# Patient Record
Sex: Female | Born: 1945 | Marital: Married
Health system: Southern US, Community
[De-identification: ages and names within clinical notes are randomized; demographics above are authoritative.]

---

## 2012-09-21 ENCOUNTER — Ambulatory Visit: Payer: Self-pay | Admitting: Cardiology

## 2012-09-29 ENCOUNTER — Ambulatory Visit: Payer: Self-pay | Admitting: Oncology

## 2012-09-30 ENCOUNTER — Ambulatory Visit: Payer: Self-pay | Admitting: Oncology

## 2012-09-30 LAB — COMPREHENSIVE METABOLIC PANEL
Albumin: 3.3 g/dL — ABNORMAL LOW (ref 3.4–5.0)
Alkaline Phosphatase: 101 U/L (ref 50–136)
Anion Gap: 12 (ref 7–16)
BUN: 11 mg/dL (ref 7–18)
Bilirubin,Total: 0.6 mg/dL (ref 0.2–1.0)
Calcium, Total: 9.8 mg/dL (ref 8.5–10.1)
Chloride: 95 mmol/L — ABNORMAL LOW (ref 98–107)
Creatinine: 0.93 mg/dL (ref 0.60–1.30)
Glucose: 136 mg/dL — ABNORMAL HIGH (ref 65–99)
Osmolality: 270 (ref 275–301)
Potassium: 4 mmol/L (ref 3.5–5.1)
SGOT(AST): 17 U/L (ref 15–37)
SGPT (ALT): 16 U/L (ref 12–78)
Sodium: 134 mmol/L — ABNORMAL LOW (ref 136–145)
Total Protein: 8.5 g/dL — ABNORMAL HIGH (ref 6.4–8.2)

## 2012-09-30 LAB — CBC CANCER CENTER
Basophil %: 0.5 %
Eosinophil %: 1.2 %
HGB: 10.8 g/dL — ABNORMAL LOW (ref 12.0–16.0)
Lymphocyte #: 1.6 x10 3/mm (ref 1.0–3.6)
Lymphocyte %: 11.6 %
MCH: 27 pg (ref 26.0–34.0)
MCHC: 33 g/dL (ref 32.0–36.0)
MCV: 82 fL (ref 80–100)
Monocyte %: 4.3 %
Neutrophil #: 11.7 x10 3/mm — ABNORMAL HIGH (ref 1.4–6.5)
Neutrophil %: 82.4 %
RBC: 3.99 10*6/uL (ref 3.80–5.20)

## 2012-09-30 LAB — APTT: Activated PTT: 40.5 secs — ABNORMAL HIGH (ref 23.6–35.9)

## 2012-10-03 ENCOUNTER — Ambulatory Visit: Payer: Self-pay | Admitting: Oncology

## 2012-10-06 ENCOUNTER — Ambulatory Visit: Payer: Self-pay | Admitting: Internal Medicine

## 2012-10-10 ENCOUNTER — Ambulatory Visit: Payer: Self-pay

## 2012-10-11 LAB — CBC CANCER CENTER
Basophil %: 0.1 %
Eosinophil #: 0.1 x10 3/mm (ref 0.0–0.7)
HGB: 10.4 g/dL — ABNORMAL LOW (ref 12.0–16.0)
Lymphocyte #: 1.5 x10 3/mm (ref 1.0–3.6)
MCHC: 32.5 g/dL (ref 32.0–36.0)
MCV: 81 fL (ref 80–100)
Monocyte #: 0.8 x10 3/mm (ref 0.2–0.9)
Neutrophil %: 86.5 %
Platelet: 377 x10 3/mm (ref 150–440)
RDW: 15.4 % — ABNORMAL HIGH (ref 11.5–14.5)
WBC: 17.7 x10 3/mm — ABNORMAL HIGH (ref 3.6–11.0)

## 2012-10-11 LAB — COMPREHENSIVE METABOLIC PANEL
Anion Gap: 6 — ABNORMAL LOW (ref 7–16)
Bilirubin,Total: 0.4 mg/dL (ref 0.2–1.0)
Calcium, Total: 8.9 mg/dL (ref 8.5–10.1)
Chloride: 97 mmol/L — ABNORMAL LOW (ref 98–107)
Co2: 29 mmol/L (ref 21–32)
Osmolality: 269 (ref 275–301)
Potassium: 4.4 mmol/L (ref 3.5–5.1)
SGPT (ALT): 21 U/L (ref 12–78)
Sodium: 132 mmol/L — ABNORMAL LOW (ref 136–145)

## 2012-10-12 LAB — PATHOLOGY REPORT

## 2012-10-17 LAB — CBC CANCER CENTER
Basophil #: 0.1 x10 3/mm (ref 0.0–0.1)
Eosinophil #: 0.3 x10 3/mm (ref 0.0–0.7)
Eosinophil %: 2.2 %
HCT: 28.3 % — ABNORMAL LOW (ref 35.0–47.0)
HGB: 9.4 g/dL — ABNORMAL LOW (ref 12.0–16.0)
Lymphocyte #: 1 x10 3/mm (ref 1.0–3.6)
Lymphocyte %: 6.9 %
MCH: 26.4 pg (ref 26.0–34.0)
MCV: 80 fL (ref 80–100)
Neutrophil %: 86.3 %
Platelet: 222 x10 3/mm (ref 150–440)
RBC: 3.55 10*6/uL — ABNORMAL LOW (ref 3.80–5.20)
WBC: 14.8 x10 3/mm — ABNORMAL HIGH (ref 3.6–11.0)

## 2012-10-17 LAB — COMPREHENSIVE METABOLIC PANEL
Albumin: 2.5 g/dL — ABNORMAL LOW (ref 3.4–5.0)
Alkaline Phosphatase: 71 U/L (ref 50–136)
Anion Gap: 6 — ABNORMAL LOW (ref 7–16)
BUN: 12 mg/dL (ref 7–18)
Bilirubin,Total: 0.5 mg/dL (ref 0.2–1.0)
Co2: 28 mmol/L (ref 21–32)
Creatinine: 0.77 mg/dL (ref 0.60–1.30)
EGFR (African American): >60
EGFR (Non-African Amer.): >60
Glucose: 131 mg/dL — ABNORMAL HIGH (ref 65–99)
Osmolality: 262 (ref 275–301)
Potassium: 4.1 mmol/L (ref 3.5–5.1)
SGPT (ALT): 19 U/L (ref 12–78)
Total Protein: 6.8 g/dL (ref 6.4–8.2)

## 2012-10-24 ENCOUNTER — Ambulatory Visit: Payer: Self-pay | Admitting: Oncology

## 2012-10-25 LAB — CBC CANCER CENTER
Basophil #: 0.1 x10 3/mm (ref 0.0–0.1)
Basophil %: 0.9 %
Eosinophil #: 0.1 x10 3/mm (ref 0.0–0.7)
Eosinophil %: 1.6 %
HCT: 30.8 % — ABNORMAL LOW (ref 35.0–47.0)
Lymphocyte #: 0.9 x10 3/mm — ABNORMAL LOW (ref 1.0–3.6)
Lymphocyte %: 15.6 %
MCH: 25.5 pg — ABNORMAL LOW (ref 26.0–34.0)
MCHC: 31.3 g/dL — ABNORMAL LOW (ref 32.0–36.0)
MCV: 81 fL (ref 80–100)
Monocyte %: 8.6 %
Neutrophil #: 4.4 x10 3/mm (ref 1.4–6.5)
RDW: 16.3 % — ABNORMAL HIGH (ref 11.5–14.5)
WBC: 6 x10 3/mm (ref 3.6–11.0)

## 2012-10-25 LAB — COMPREHENSIVE METABOLIC PANEL
Alkaline Phosphatase: 101 U/L (ref 50–136)
Anion Gap: 9 (ref 7–16)
BUN: 6 mg/dL — ABNORMAL LOW (ref 7–18)
Bilirubin,Total: 0.2 mg/dL (ref 0.2–1.0)
Chloride: 96 mmol/L — ABNORMAL LOW (ref 98–107)
Creatinine: 0.85 mg/dL (ref 0.60–1.30)
EGFR (African American): >60
EGFR (Non-African Amer.): >60
Glucose: 210 mg/dL — ABNORMAL HIGH (ref 65–99)
Osmolality: 270 (ref 275–301)
SGOT(AST): 18 U/L (ref 15–37)
SGPT (ALT): 27 U/L (ref 12–78)
Sodium: 133 mmol/L — ABNORMAL LOW (ref 136–145)
Total Protein: 6.9 g/dL (ref 6.4–8.2)

## 2012-10-25 LAB — URINALYSIS, COMPLETE
Bilirubin,UR: NEGATIVE
Hyaline Cast: 42
Nitrite: NEGATIVE
Ph: 5 (ref 4.5–8.0)
RBC,UR: 1 /HPF (ref 0–5)
Specific Gravity: 1.013 (ref 1.003–1.030)
Squamous Epithelial: 2
WBC UR: 57 /HPF (ref 0–5)

## 2012-10-27 LAB — URINE CULTURE

## 2012-11-04 LAB — COMPREHENSIVE METABOLIC PANEL
Bilirubin,Total: 0.2 mg/dL (ref 0.2–1.0)
Chloride: 99 mmol/L (ref 98–107)
Co2: 24 mmol/L (ref 21–32)
EGFR (Non-African Amer.): >60
Glucose: 153 mg/dL — ABNORMAL HIGH (ref 65–99)
Osmolality: 274 (ref 275–301)
SGPT (ALT): 25 U/L (ref 12–78)

## 2012-11-04 LAB — CBC CANCER CENTER
Eosinophil #: 0.1 x10 3/mm (ref 0.0–0.7)
HCT: 30.1 % — ABNORMAL LOW (ref 35.0–47.0)
HGB: 9.6 g/dL — ABNORMAL LOW (ref 12.0–16.0)
Lymphocyte #: 0.8 x10 3/mm — ABNORMAL LOW (ref 1.0–3.6)
MCH: 25.9 pg — ABNORMAL LOW (ref 26.0–34.0)
MCHC: 32.1 g/dL (ref 32.0–36.0)
Monocyte #: 0.5 x10 3/mm (ref 0.2–0.9)
Neutrophil #: 3.6 x10 3/mm (ref 1.4–6.5)
Neutrophil %: 73 %
Platelet: 221 x10 3/mm (ref 150–440)
RBC: 3.72 10*6/uL — ABNORMAL LOW (ref 3.80–5.20)
WBC: 5 x10 3/mm (ref 3.6–11.0)

## 2012-11-10 LAB — CBC CANCER CENTER
Basophil %: 1.3 %
HGB: 9.8 g/dL — ABNORMAL LOW (ref 12.0–16.0)
Lymphocyte %: 17.9 %
MCH: 26 pg (ref 26.0–34.0)
Monocyte #: 0.3 x10 3/mm (ref 0.2–0.9)
Neutrophil %: 70.6 %
Platelet: 189 x10 3/mm (ref 150–440)
RDW: 17.6 % — ABNORMAL HIGH (ref 11.5–14.5)
WBC: 4 x10 3/mm (ref 3.6–11.0)

## 2012-11-11 LAB — COMPREHENSIVE METABOLIC PANEL
Albumin: 3 g/dL — ABNORMAL LOW (ref 3.4–5.0)
Anion Gap: 8 (ref 7–16)
Bilirubin,Total: 0.2 mg/dL (ref 0.2–1.0)
Creatinine: 0.73 mg/dL (ref 0.60–1.30)
EGFR (African American): >60
EGFR (Non-African Amer.): >60
Glucose: 132 mg/dL — ABNORMAL HIGH (ref 65–99)
Potassium: 4 mmol/L (ref 3.5–5.1)
SGOT(AST): 16 U/L (ref 15–37)
SGPT (ALT): 19 U/L (ref 12–78)
Sodium: 134 mmol/L — ABNORMAL LOW (ref 136–145)
Total Protein: 6.6 g/dL (ref 6.4–8.2)

## 2012-11-14 ENCOUNTER — Inpatient Hospital Stay: Payer: Self-pay | Admitting: Oncology

## 2012-11-15 LAB — CBC WITH DIFFERENTIAL/PLATELET
Basophil #: 0 10*3/uL (ref 0.0–0.1)
Basophil %: 0.2 %
HGB: 7.9 g/dL — ABNORMAL LOW (ref 12.0–16.0)
Lymphocyte #: 0.4 10*3/uL — ABNORMAL LOW (ref 1.0–3.6)
MCH: 26.8 pg (ref 26.0–34.0)
MCHC: 33.4 g/dL (ref 32.0–36.0)
MCV: 80 fL (ref 80–100)
Monocyte #: 0.1 x10 3/mm — ABNORMAL LOW (ref 0.2–0.9)
Neutrophil %: 91.1 %
Platelet: 106 10*3/uL — ABNORMAL LOW (ref 150–440)
RBC: 2.95 10*6/uL — ABNORMAL LOW (ref 3.80–5.20)
RDW: 18.1 % — ABNORMAL HIGH (ref 11.5–14.5)
WBC: 7.1 10*3/uL (ref 3.6–11.0)

## 2012-11-15 LAB — BASIC METABOLIC PANEL
Anion Gap: 6 — ABNORMAL LOW (ref 7–16)
Co2: 28 mmol/L (ref 21–32)
EGFR (African American): >60
Osmolality: 260 (ref 275–301)
Sodium: 130 mmol/L — ABNORMAL LOW (ref 136–145)

## 2012-11-18 LAB — CBC CANCER CENTER
Basophil #: 0 x10 3/mm (ref 0.0–0.1)
Eosinophil %: 1.6 %
HCT: 25.2 % — ABNORMAL LOW (ref 35.0–47.0)
Lymphocyte #: 0.3 x10 3/mm — ABNORMAL LOW (ref 1.0–3.6)
MCV: 82 fL (ref 80–100)
Monocyte #: 0.3 x10 3/mm (ref 0.2–0.9)
Monocyte %: 7.9 %
Neutrophil #: 3.2 x10 3/mm (ref 1.4–6.5)
Neutrophil %: 82.5 %
RBC: 3.06 10*6/uL — ABNORMAL LOW (ref 3.80–5.20)
WBC: 3.8 x10 3/mm (ref 3.6–11.0)

## 2012-11-18 LAB — COMPREHENSIVE METABOLIC PANEL
Albumin: 2.5 g/dL — ABNORMAL LOW (ref 3.4–5.0)
Alkaline Phosphatase: 116 U/L (ref 50–136)
Anion Gap: 9 (ref 7–16)
BUN: 4 mg/dL — ABNORMAL LOW (ref 7–18)
Bilirubin,Total: 0.3 mg/dL (ref 0.2–1.0)
Calcium, Total: 8.2 mg/dL — ABNORMAL LOW (ref 8.5–10.1)
Chloride: 94 mmol/L — ABNORMAL LOW (ref 98–107)
Co2: 31 mmol/L (ref 21–32)
Creatinine: 0.8 mg/dL (ref 0.60–1.30)
Glucose: 146 mg/dL — ABNORMAL HIGH (ref 65–99)
Osmolality: 268 (ref 275–301)
Potassium: 3.6 mmol/L (ref 3.5–5.1)
SGOT(AST): 21 U/L (ref 15–37)
Sodium: 134 mmol/L — ABNORMAL LOW (ref 136–145)
Total Protein: 6.9 g/dL (ref 6.4–8.2)

## 2012-11-19 LAB — CULTURE, BLOOD (SINGLE)

## 2012-11-23 ENCOUNTER — Ambulatory Visit: Payer: Self-pay | Admitting: Oncology

## 2012-11-23 LAB — CBC CANCER CENTER
Eosinophil %: 0.2 %
HCT: 26.9 % — ABNORMAL LOW (ref 35.0–47.0)
Lymphocyte %: 8.5 %
Monocyte #: 0.3 x10 3/mm (ref 0.2–0.9)
Monocyte %: 5.6 %
Neutrophil %: 85.6 %
Platelet: 116 x10 3/mm — ABNORMAL LOW (ref 150–440)
RBC: 3.28 10*6/uL — ABNORMAL LOW (ref 3.80–5.20)

## 2012-11-25 LAB — CBC CANCER CENTER
Eosinophil #: 0 x10 3/mm (ref 0.0–0.7)
Eosinophil %: 0.5 %
HGB: 8.3 g/dL — ABNORMAL LOW (ref 12.0–16.0)
MCV: 81 fL (ref 80–100)
Monocyte %: 8.6 %
Neutrophil #: 4 x10 3/mm (ref 1.4–6.5)
RDW: 22 % — ABNORMAL HIGH (ref 11.5–14.5)
WBC: 5.1 x10 3/mm (ref 3.6–11.0)

## 2012-11-25 LAB — COMPREHENSIVE METABOLIC PANEL
Albumin: 2.5 g/dL — ABNORMAL LOW (ref 3.4–5.0)
Alkaline Phosphatase: 104 U/L (ref 50–136)
Anion Gap: 12 (ref 7–16)
Bilirubin,Total: 0.4 mg/dL (ref 0.2–1.0)
Calcium, Total: 6.5 mg/dL — CL (ref 8.5–10.1)
EGFR (Non-African Amer.): >60
Glucose: 120 mg/dL — ABNORMAL HIGH (ref 65–99)
Potassium: 3.2 mmol/L — ABNORMAL LOW (ref 3.5–5.1)
SGOT(AST): 33 U/L (ref 15–37)
Sodium: 132 mmol/L — ABNORMAL LOW (ref 136–145)

## 2012-12-02 LAB — CBC CANCER CENTER
Basophil #: 0 x10 3/mm (ref 0.0–0.1)
Eosinophil #: 0.1 x10 3/mm (ref 0.0–0.7)
Eosinophil %: 4 %
Neutrophil %: 65.9 %
Platelet: 361 x10 3/mm (ref 150–440)
RBC: 3.19 10*6/uL — ABNORMAL LOW (ref 3.80–5.20)
RDW: 21.8 % — ABNORMAL HIGH (ref 11.5–14.5)

## 2012-12-02 LAB — COMPREHENSIVE METABOLIC PANEL
Anion Gap: 12 (ref 7–16)
BUN: 6 mg/dL — ABNORMAL LOW (ref 7–18)
Chloride: 96 mmol/L — ABNORMAL LOW (ref 98–107)
Co2: 29 mmol/L (ref 21–32)
Creatinine: 0.73 mg/dL (ref 0.60–1.30)
EGFR (African American): >60
EGFR (Non-African Amer.): >60
Glucose: 98 mg/dL (ref 65–99)
Potassium: 3.6 mmol/L (ref 3.5–5.1)
Sodium: 137 mmol/L (ref 136–145)

## 2012-12-08 LAB — CBC CANCER CENTER
Basophil #: 0 x10 3/mm (ref 0.0–0.1)
Eosinophil %: 2.1 %
HCT: 26.5 % — ABNORMAL LOW (ref 35.0–47.0)
HGB: 8.7 g/dL — ABNORMAL LOW (ref 12.0–16.0)
Lymphocyte %: 16.2 %
MCH: 27.9 pg (ref 26.0–34.0)
Monocyte #: 0.2 x10 3/mm (ref 0.2–0.9)
Monocyte %: 8.9 %
Neutrophil #: 2 x10 3/mm (ref 1.4–6.5)
Neutrophil %: 71.6 %
Platelet: 329 x10 3/mm (ref 150–440)
RDW: 22.4 % — ABNORMAL HIGH (ref 11.5–14.5)

## 2012-12-24 ENCOUNTER — Ambulatory Visit: Payer: Self-pay | Admitting: Oncology

## 2013-01-06 LAB — COMPREHENSIVE METABOLIC PANEL
Albumin: 2.9 g/dL — ABNORMAL LOW (ref 3.4–5.0)
Alkaline Phosphatase: 84 U/L (ref 50–136)
Anion Gap: 7 (ref 7–16)
BUN: 8 mg/dL (ref 7–18)
Chloride: 105 mmol/L (ref 98–107)
Co2: 31 mmol/L (ref 21–32)
Creatinine: 0.81 mg/dL (ref 0.60–1.30)
EGFR (African American): >60
EGFR (Non-African Amer.): >60
Glucose: 115 mg/dL — ABNORMAL HIGH (ref 65–99)
SGPT (ALT): 13 U/L (ref 12–78)
Sodium: 143 mmol/L (ref 136–145)

## 2013-01-06 LAB — CBC CANCER CENTER
Basophil %: 0.5 %
HCT: 28.3 % — ABNORMAL LOW (ref 35.0–47.0)
HGB: 9.2 g/dL — ABNORMAL LOW (ref 12.0–16.0)
Lymphocyte %: 14 %
MCH: 30.3 pg (ref 26.0–34.0)
Monocyte #: 0.4 x10 3/mm (ref 0.2–0.9)
Platelet: 299 x10 3/mm (ref 150–440)
RBC: 3.05 10*6/uL — ABNORMAL LOW (ref 3.80–5.20)
WBC: 7.8 x10 3/mm (ref 3.6–11.0)

## 2013-01-16 LAB — CBC CANCER CENTER
Basophil %: 0.5 %
HGB: 10.1 g/dL — ABNORMAL LOW (ref 12.0–16.0)
Lymphocyte #: 0.8 x10 3/mm — ABNORMAL LOW (ref 1.0–3.6)
Monocyte #: 1.3 x10 3/mm — ABNORMAL HIGH (ref 0.2–0.9)
Monocyte %: 10.7 %
Neutrophil #: 9.5 x10 3/mm — ABNORMAL HIGH (ref 1.4–6.5)
Neutrophil %: 79.2 %
RDW: 21.5 % — ABNORMAL HIGH (ref 11.5–14.5)
WBC: 11.9 x10 3/mm — ABNORMAL HIGH (ref 3.6–11.0)

## 2013-01-16 LAB — COMPREHENSIVE METABOLIC PANEL
Albumin: 3.6 g/dL (ref 3.4–5.0)
BUN: 7 mg/dL (ref 7–18)
Co2: 25 mmol/L (ref 21–32)
Creatinine: 0.81 mg/dL (ref 0.60–1.30)
EGFR (African American): >60
EGFR (Non-African Amer.): >60
Potassium: 2.9 mmol/L — ABNORMAL LOW (ref 3.5–5.1)
Total Protein: 7.3 g/dL (ref 6.4–8.2)

## 2013-01-23 ENCOUNTER — Ambulatory Visit: Payer: Self-pay | Admitting: Oncology

## 2013-01-30 LAB — CBC CANCER CENTER
Basophil #: 0.1 x10 3/mm (ref 0.0–0.1)
Basophil %: 0.6 %
Eosinophil #: 0.1 x10 3/mm (ref 0.0–0.7)
HCT: 24.9 % — ABNORMAL LOW (ref 35.0–47.0)
HGB: 8.1 g/dL — ABNORMAL LOW (ref 12.0–16.0)
Lymphocyte %: 6.1 %
MCHC: 32.6 g/dL (ref 32.0–36.0)
MCV: 97 fL (ref 80–100)
Monocyte %: 6.8 %
Neutrophil %: 86 %
RBC: 2.56 10*6/uL — ABNORMAL LOW (ref 3.80–5.20)
RDW: 20 % — ABNORMAL HIGH (ref 11.5–14.5)
WBC: 15 x10 3/mm — ABNORMAL HIGH (ref 3.6–11.0)

## 2013-01-30 LAB — COMPREHENSIVE METABOLIC PANEL
Albumin: 2.9 g/dL — ABNORMAL LOW (ref 3.4–5.0)
Alkaline Phosphatase: 66 U/L
Bilirubin,Total: 0.4 mg/dL (ref 0.2–1.0)
Calcium, Total: 6 mg/dL — CL (ref 8.5–10.1)
Chloride: 97 mmol/L — ABNORMAL LOW (ref 98–107)
Co2: 27 mmol/L (ref 21–32)
EGFR (African American): 59 — ABNORMAL LOW
EGFR (Non-African Amer.): 51 — ABNORMAL LOW
Glucose: 197 mg/dL — ABNORMAL HIGH (ref 65–99)
SGOT(AST): 13 U/L — ABNORMAL LOW (ref 15–37)
SGPT (ALT): 12 U/L (ref 12–78)
Total Protein: 7 g/dL (ref 6.4–8.2)

## 2013-02-03 ENCOUNTER — Inpatient Hospital Stay: Payer: Self-pay | Admitting: Internal Medicine

## 2013-02-03 LAB — URINALYSIS, COMPLETE
Glucose,UR: NEGATIVE mg/dL (ref 0–75)
Ketone: NEGATIVE
Nitrite: NEGATIVE
Ph: 5 (ref 4.5–8.0)
RBC,UR: 4 /HPF (ref 0–5)
Squamous Epithelial: 1
WBC UR: 9 /HPF (ref 0–5)

## 2013-02-03 LAB — COMPREHENSIVE METABOLIC PANEL
Albumin: 2.3 g/dL — ABNORMAL LOW (ref 3.4–5.0)
Alkaline Phosphatase: 67 U/L
Anion Gap: 6 — ABNORMAL LOW (ref 7–16)
BUN: 21 mg/dL — ABNORMAL HIGH (ref 7–18)
BUN: 23 mg/dL — ABNORMAL HIGH (ref 7–18)
Bilirubin,Total: 0.5 mg/dL (ref 0.2–1.0)
Bilirubin,Total: 0.5 mg/dL (ref 0.2–1.0)
Calcium, Total: 7.4 mg/dL — ABNORMAL LOW (ref 8.5–10.1)
Calcium, Total: 6.6 mg/dL — CL (ref 8.5–10.1)
Chloride: 98 mmol/L (ref 98–107)
Chloride: 101 mmol/L (ref 98–107)
Co2: 24 mmol/L (ref 21–32)
Co2: 25 mmol/L (ref 21–32)
EGFR (African American): 54 — ABNORMAL LOW
EGFR (Non-African Amer.): 39 — ABNORMAL LOW
Glucose: 122 mg/dL — ABNORMAL HIGH (ref 65–99)
Glucose: 150 mg/dL — ABNORMAL HIGH (ref 65–99)
Osmolality: 271 (ref 275–301)
Potassium: 5.4 mmol/L — ABNORMAL HIGH (ref 3.5–5.1)
SGOT(AST): 28 U/L (ref 15–37)
SGPT (ALT): 10 U/L — ABNORMAL LOW (ref 12–78)
SGPT (ALT): 13 U/L (ref 12–78)
Sodium: 131 mmol/L — ABNORMAL LOW (ref 136–145)
Sodium: 132 mmol/L — ABNORMAL LOW (ref 136–145)
Total Protein: 6.7 g/dL (ref 6.4–8.2)
Total Protein: 6.1 g/dL — ABNORMAL LOW (ref 6.4–8.2)

## 2013-02-03 LAB — CBC WITH DIFFERENTIAL/PLATELET
Eosinophil #: 0 10*3/uL (ref 0.0–0.7)
Eosinophil %: 0.2 %
HCT: 22.9 % — ABNORMAL LOW (ref 35.0–47.0)
Lymphocyte %: 6.1 %
MCV: 99 fL (ref 80–100)
Monocyte #: 0.2 x10 3/mm (ref 0.2–0.9)
Neutrophil #: 11 10*3/uL — ABNORMAL HIGH (ref 1.4–6.5)
Platelet: 96 10*3/uL — ABNORMAL LOW (ref 150–440)
RDW: 17.6 % — ABNORMAL HIGH (ref 11.5–14.5)

## 2013-02-03 LAB — CBC
MCH: 30 pg (ref 26.0–34.0)
MCHC: 31.1 g/dL — ABNORMAL LOW (ref 32.0–36.0)
Platelet: 99 10*3/uL — ABNORMAL LOW (ref 150–440)
RDW: 17.7 % — ABNORMAL HIGH (ref 11.5–14.5)
WBC: 11.1 10*3/uL — ABNORMAL HIGH (ref 3.6–11.0)

## 2013-02-03 LAB — PRO B NATRIURETIC PEPTIDE: B-Type Natriuretic Peptide: 4734 pg/mL — ABNORMAL HIGH (ref 0–125)

## 2013-02-03 LAB — CK TOTAL AND CKMB (NOT AT ARMC)
CK, Total: 61 U/L (ref 21–215)
CK-MB: <0.5 ng/mL — ABNORMAL LOW (ref 0.5–3.6)

## 2013-02-03 LAB — TROPONIN I
Troponin-I: <0.02 ng/mL
Troponin-I: <0.02 ng/mL

## 2013-02-03 LAB — MAGNESIUM: Magnesium: 0.6 mg/dL — ABNORMAL LOW

## 2013-02-04 LAB — BASIC METABOLIC PANEL
Anion Gap: 7 (ref 7–16)
Chloride: 103 mmol/L (ref 98–107)
Co2: 23 mmol/L (ref 21–32)
Creatinine: 1.32 mg/dL — ABNORMAL HIGH (ref 0.60–1.30)
EGFR (African American): 49 — ABNORMAL LOW
EGFR (Non-African Amer.): 42 — ABNORMAL LOW
Glucose: 167 mg/dL — ABNORMAL HIGH (ref 65–99)
Osmolality: 275 (ref 275–301)
Potassium: 4.8 mmol/L (ref 3.5–5.1)

## 2013-02-04 LAB — CBC WITH DIFFERENTIAL/PLATELET
Basophil #: 0 10*3/uL (ref 0.0–0.1)
Basophil %: 0.1 %
Eosinophil #: 0.1 10*3/uL (ref 0.0–0.7)
HCT: 23 % — ABNORMAL LOW (ref 35.0–47.0)
HGB: 7.7 g/dL — ABNORMAL LOW (ref 12.0–16.0)
MCHC: 33.6 g/dL (ref 32.0–36.0)
MCV: 97 fL (ref 80–100)
Monocyte #: 0.1 x10 3/mm — ABNORMAL LOW (ref 0.2–0.9)
Monocyte %: 1.7 %
Neutrophil #: 7.2 10*3/uL — ABNORMAL HIGH (ref 1.4–6.5)
Neutrophil %: 95 %
Platelet: 75 10*3/uL — ABNORMAL LOW (ref 150–440)
RBC: 2.38 10*6/uL — ABNORMAL LOW (ref 3.80–5.20)
RDW: 17.3 % — ABNORMAL HIGH (ref 11.5–14.5)
WBC: 7.6 10*3/uL (ref 3.6–11.0)

## 2013-02-04 LAB — MAGNESIUM: Magnesium: 0.5 mg/dL — ABNORMAL LOW

## 2013-02-04 LAB — TSH: Thyroid Stimulating Horm: 1.74 u[IU]/mL

## 2013-02-04 LAB — LIPID PANEL
HDL Cholesterol: 16 mg/dL — ABNORMAL LOW (ref 40–60)
Ldl Cholesterol, Calc: 51 mg/dL (ref 0–100)

## 2013-02-05 LAB — CBC WITH DIFFERENTIAL/PLATELET
Basophil #: 0 10*3/uL (ref 0.0–0.1)
Eosinophil #: 0 10*3/uL (ref 0.0–0.7)
Eosinophil %: 0.1 %
HGB: 7.3 g/dL — ABNORMAL LOW (ref 12.0–16.0)
Lymphocyte #: 0.1 10*3/uL — ABNORMAL LOW (ref 1.0–3.6)
Lymphocyte %: 2.1 %
MCHC: 33.6 g/dL (ref 32.0–36.0)
MCV: 96 fL (ref 80–100)
Monocyte %: 3.4 %
Neutrophil #: 4.5 10*3/uL (ref 1.4–6.5)
Platelet: 53 10*3/uL — ABNORMAL LOW (ref 150–440)
RBC: 2.25 10*6/uL — ABNORMAL LOW (ref 3.80–5.20)
WBC: 4.8 10*3/uL (ref 3.6–11.0)

## 2013-02-05 LAB — COMPREHENSIVE METABOLIC PANEL
Albumin: 1.8 g/dL — ABNORMAL LOW (ref 3.4–5.0)
Anion Gap: 9 (ref 7–16)
Calcium, Total: 7.3 mg/dL — ABNORMAL LOW (ref 8.5–10.1)
Co2: 22 mmol/L (ref 21–32)
Creatinine: 0.99 mg/dL (ref 0.60–1.30)
EGFR (African American): >60
Glucose: 184 mg/dL — ABNORMAL HIGH (ref 65–99)
Potassium: 3.7 mmol/L (ref 3.5–5.1)
SGOT(AST): 20 U/L (ref 15–37)
SGPT (ALT): 11 U/L — ABNORMAL LOW (ref 12–78)
Sodium: 132 mmol/L — ABNORMAL LOW (ref 136–145)
Total Protein: 6 g/dL — ABNORMAL LOW (ref 6.4–8.2)

## 2013-02-05 LAB — MAGNESIUM: Magnesium: 1.7 mg/dL — ABNORMAL LOW

## 2013-02-06 LAB — CBC WITH DIFFERENTIAL/PLATELET
Basophil %: 0.1 %
Eosinophil #: 0 10*3/uL (ref 0.0–0.7)
HCT: 21.1 % — ABNORMAL LOW (ref 35.0–47.0)
HGB: 7.1 g/dL — ABNORMAL LOW (ref 12.0–16.0)
Lymphocyte #: 0.1 10*3/uL — ABNORMAL LOW (ref 1.0–3.6)
MCHC: 33.7 g/dL (ref 32.0–36.0)
MCV: 96 fL (ref 80–100)
Monocyte #: 0.2 x10 3/mm (ref 0.2–0.9)
Monocyte %: 4.2 %
Neutrophil %: 92.9 %
WBC: 3.9 10*3/uL (ref 3.6–11.0)

## 2013-02-06 LAB — BASIC METABOLIC PANEL
BUN: 19 mg/dL — ABNORMAL HIGH (ref 7–18)
Calcium, Total: 8 mg/dL — ABNORMAL LOW (ref 8.5–10.1)
Chloride: 102 mmol/L (ref 98–107)
Creatinine: 0.94 mg/dL (ref 0.60–1.30)
EGFR (African American): >60
Glucose: 168 mg/dL — ABNORMAL HIGH (ref 65–99)
Osmolality: 272 (ref 275–301)

## 2013-02-07 LAB — BASIC METABOLIC PANEL
BUN: 26 mg/dL — ABNORMAL HIGH (ref 7–18)
Calcium, Total: 8.8 mg/dL (ref 8.5–10.1)
Chloride: 104 mmol/L (ref 98–107)
Co2: 23 mmol/L (ref 21–32)
Creatinine: 0.92 mg/dL (ref 0.60–1.30)
EGFR (African American): >60
EGFR (Non-African Amer.): >60
Osmolality: 280 (ref 275–301)
Potassium: 3.8 mmol/L (ref 3.5–5.1)
Sodium: 135 mmol/L — ABNORMAL LOW (ref 136–145)

## 2013-02-07 LAB — CBC WITH DIFFERENTIAL/PLATELET
Eosinophil #: 0 10*3/uL (ref 0.0–0.7)
Eosinophil %: 0 %
HGB: 7.4 g/dL — ABNORMAL LOW (ref 12.0–16.0)
Lymphocyte %: 3.8 %
MCH: 32 pg (ref 26.0–34.0)
MCHC: 33.4 g/dL (ref 32.0–36.0)
MCV: 96 fL (ref 80–100)
Monocyte #: 0.2 x10 3/mm (ref 0.2–0.9)
Neutrophil #: 4.1 10*3/uL (ref 1.4–6.5)
RBC: 2.3 10*6/uL — ABNORMAL LOW (ref 3.80–5.20)
RDW: 17.6 % — ABNORMAL HIGH (ref 11.5–14.5)
WBC: 4.5 10*3/uL (ref 3.6–11.0)

## 2013-02-07 LAB — MAGNESIUM: Magnesium: 2.1 mg/dL

## 2013-02-07 LAB — PHOSPHORUS: Phosphorus: 2.8 mg/dL (ref 2.5–4.9)

## 2013-02-08 LAB — BASIC METABOLIC PANEL
Calcium, Total: 8.9 mg/dL (ref 8.5–10.1)
Chloride: 107 mmol/L (ref 98–107)
Co2: 25 mmol/L (ref 21–32)
Creatinine: 0.85 mg/dL (ref 0.60–1.30)
Glucose: 220 mg/dL — ABNORMAL HIGH (ref 65–99)
Osmolality: 294 (ref 275–301)
Sodium: 140 mmol/L (ref 136–145)

## 2013-02-08 LAB — MAGNESIUM: Magnesium: 1.9 mg/dL

## 2013-02-08 LAB — EXPECTORATED SPUTUM ASSESSMENT W GRAM STAIN, RFLX TO RESP C

## 2013-02-09 LAB — POTASSIUM: Potassium: 3.6 mmol/L (ref 3.5–5.1)

## 2013-02-10 LAB — MAGNESIUM: Magnesium: 1.3 mg/dL — ABNORMAL LOW

## 2013-02-11 LAB — CBC WITH DIFFERENTIAL/PLATELET
Basophil #: 0 10*3/uL (ref 0.0–0.1)
Basophil %: 0.1 %
Eosinophil #: 0 10*3/uL (ref 0.0–0.7)
HCT: 26.8 % — ABNORMAL LOW (ref 35.0–47.0)
HGB: 8.7 g/dL — ABNORMAL LOW (ref 12.0–16.0)
Lymphocyte %: 4.1 %
MCH: 31.9 pg (ref 26.0–34.0)
Monocyte %: 2.6 %
Neutrophil #: 14.9 10*3/uL — ABNORMAL HIGH (ref 1.4–6.5)
Platelet: 73 10*3/uL — ABNORMAL LOW (ref 150–440)
RBC: 2.74 10*6/uL — ABNORMAL LOW (ref 3.80–5.20)
RDW: 17.1 % — ABNORMAL HIGH (ref 11.5–14.5)
WBC: 16 10*3/uL — ABNORMAL HIGH (ref 3.6–11.0)

## 2013-02-11 LAB — COMPREHENSIVE METABOLIC PANEL
Bilirubin,Total: 0.4 mg/dL (ref 0.2–1.0)
Chloride: 107 mmol/L (ref 98–107)
Co2: 33 mmol/L — ABNORMAL HIGH (ref 21–32)
Glucose: 181 mg/dL — ABNORMAL HIGH (ref 65–99)
SGPT (ALT): 25 U/L (ref 12–78)
Total Protein: 5.7 g/dL — ABNORMAL LOW (ref 6.4–8.2)

## 2013-02-12 LAB — CBC WITH DIFFERENTIAL/PLATELET
Basophil %: 0.1 %
Eosinophil #: 0 10*3/uL (ref 0.0–0.7)
HCT: 24.9 % — ABNORMAL LOW (ref 35.0–47.0)
Lymphocyte #: 1 10*3/uL (ref 1.0–3.6)
Lymphocyte %: 6.4 %
MCH: 31.5 pg (ref 26.0–34.0)
MCHC: 32.1 g/dL (ref 32.0–36.0)
MCV: 98 fL (ref 80–100)
Monocyte %: 3.6 %
RBC: 2.54 10*6/uL — ABNORMAL LOW (ref 3.80–5.20)
WBC: 15.1 10*3/uL — ABNORMAL HIGH (ref 3.6–11.0)

## 2013-02-12 LAB — BASIC METABOLIC PANEL
Anion Gap: 3 — ABNORMAL LOW (ref 7–16)
BUN: 24 mg/dL — ABNORMAL HIGH (ref 7–18)
Creatinine: 0.67 mg/dL (ref 0.60–1.30)
EGFR (African American): >60
EGFR (Non-African Amer.): >60
Glucose: 108 mg/dL — ABNORMAL HIGH (ref 65–99)
Osmolality: 293 (ref 275–301)
Potassium: 3.6 mmol/L (ref 3.5–5.1)
Sodium: 145 mmol/L (ref 136–145)

## 2013-02-12 LAB — MAGNESIUM: Magnesium: 1.3 mg/dL — ABNORMAL LOW

## 2013-02-13 LAB — CBC WITH DIFFERENTIAL/PLATELET
Basophil #: 0 10*3/uL (ref 0.0–0.1)
Eosinophil #: 0 10*3/uL (ref 0.0–0.7)
Eosinophil %: 0.3 %
HCT: 24.2 % — ABNORMAL LOW (ref 35.0–47.0)
HGB: 8.1 g/dL — ABNORMAL LOW (ref 12.0–16.0)
Lymphocyte #: 0.8 10*3/uL — ABNORMAL LOW (ref 1.0–3.6)
MCH: 33.1 pg (ref 26.0–34.0)
MCHC: 33.6 g/dL (ref 32.0–36.0)
MCV: 99 fL (ref 80–100)
Monocyte %: 4.1 %
Neutrophil #: 10.6 10*3/uL — ABNORMAL HIGH (ref 1.4–6.5)
Platelet: 63 10*3/uL — ABNORMAL LOW (ref 150–440)
RBC: 2.45 10*6/uL — ABNORMAL LOW (ref 3.80–5.20)
WBC: 12 10*3/uL — ABNORMAL HIGH (ref 3.6–11.0)

## 2013-02-20 ENCOUNTER — Inpatient Hospital Stay: Payer: Self-pay | Admitting: Student

## 2013-02-20 LAB — CBC WITH DIFFERENTIAL/PLATELET
Basophil %: 0.4 %
Eosinophil #: 0 10*3/uL (ref 0.0–0.7)
HCT: 19.3 % — ABNORMAL LOW (ref 35.0–47.0)
HGB: 6.4 g/dL — ABNORMAL LOW (ref 12.0–16.0)
Lymphocyte #: 0.2 10*3/uL — ABNORMAL LOW (ref 1.0–3.6)
Lymphocyte %: 3.9 %
MCH: 32 pg (ref 26.0–34.0)
MCHC: 33.1 g/dL (ref 32.0–36.0)
MCV: 97 fL (ref 80–100)
Monocyte #: 0.2 x10 3/mm (ref 0.2–0.9)
Monocyte %: 5 %
Neutrophil %: 90.5 %
Platelet: 59 10*3/uL — ABNORMAL LOW (ref 150–440)
RBC: 1.99 10*6/uL — ABNORMAL LOW (ref 3.80–5.20)
WBC: 4.9 10*3/uL (ref 3.6–11.0)

## 2013-02-20 LAB — TROPONIN I: Troponin-I: <0.02 ng/mL

## 2013-02-20 LAB — COMPREHENSIVE METABOLIC PANEL
Anion Gap: 6 — ABNORMAL LOW (ref 7–16)
BUN: 12 mg/dL (ref 7–18)
Chloride: 89 mmol/L — ABNORMAL LOW (ref 98–107)
Osmolality: 256 (ref 275–301)
SGOT(AST): 19 U/L (ref 15–37)
SGPT (ALT): 14 U/L (ref 12–78)
Sodium: 129 mmol/L — ABNORMAL LOW (ref 136–145)

## 2013-02-20 LAB — URINALYSIS, COMPLETE
Bacteria: NONE SEEN
Bilirubin,UR: NEGATIVE
Glucose,UR: NEGATIVE mg/dL (ref 0–75)
Ketone: NEGATIVE
Nitrite: NEGATIVE
Ph: 5 (ref 4.5–8.0)
Protein: NEGATIVE
Squamous Epithelial: 1
WBC UR: <1 /HPF (ref 0–5)

## 2013-02-20 LAB — PRO B NATRIURETIC PEPTIDE: B-Type Natriuretic Peptide: 15630 pg/mL — ABNORMAL HIGH (ref 0–125)

## 2013-02-20 LAB — MAGNESIUM: Magnesium: 0.8 mg/dL — ABNORMAL LOW

## 2013-02-21 LAB — CBC WITH DIFFERENTIAL/PLATELET
Basophil %: 0.5 %
Eosinophil %: 0.1 %
HCT: 27.6 % — ABNORMAL LOW (ref 35.0–47.0)
HGB: 9.5 g/dL — ABNORMAL LOW (ref 12.0–16.0)
MCH: 30.6 pg (ref 26.0–34.0)
MCHC: 34.4 g/dL (ref 32.0–36.0)
Neutrophil #: 6.5 10*3/uL (ref 1.4–6.5)
Neutrophil %: 88.9 %
Platelet: 65 10*3/uL — ABNORMAL LOW (ref 150–440)
RDW: 23.4 % — ABNORMAL HIGH (ref 11.5–14.5)

## 2013-02-21 LAB — MAGNESIUM: Magnesium: 0.6 mg/dL — ABNORMAL LOW

## 2013-02-22 LAB — PLATELET COUNT: Platelet: 63 10*3/uL — ABNORMAL LOW (ref 150–440)

## 2013-02-22 LAB — BASIC METABOLIC PANEL
Anion Gap: 1 — ABNORMAL LOW (ref 7–16)
BUN: 13 mg/dL (ref 7–18)
Chloride: 87 mmol/L — ABNORMAL LOW (ref 98–107)
Co2: 40 mmol/L (ref 21–32)
Creatinine: 0.83 mg/dL (ref 0.60–1.30)
EGFR (Non-African Amer.): >60
Glucose: 74 mg/dL (ref 65–99)
Osmolality: 256 (ref 275–301)
Potassium: 3.4 mmol/L — ABNORMAL LOW (ref 3.5–5.1)
Sodium: 128 mmol/L — ABNORMAL LOW (ref 136–145)

## 2013-02-23 ENCOUNTER — Ambulatory Visit: Payer: Self-pay | Admitting: Oncology

## 2013-02-23 LAB — BASIC METABOLIC PANEL
ANION GAP: 1 — AB (ref 7–16)
BUN: 16 mg/dL (ref 7–18)
CO2: 41 mmol/L — AB (ref 21–32)
CREATININE: 0.77 mg/dL (ref 0.60–1.30)
Calcium, Total: 8.3 mg/dL — ABNORMAL LOW (ref 8.5–10.1)
Chloride: 87 mmol/L — ABNORMAL LOW (ref 98–107)
EGFR (African American): >60
EGFR (Non-African Amer.): >60
Glucose: 138 mg/dL — ABNORMAL HIGH (ref 65–99)
Osmolality: 262 (ref 275–301)
Potassium: 3.8 mmol/L (ref 3.5–5.1)
Sodium: 129 mmol/L — ABNORMAL LOW (ref 136–145)

## 2013-02-23 LAB — VANCOMYCIN, TROUGH: Vancomycin, Trough: 14 ug/mL (ref 10–20)

## 2013-02-23 LAB — MAGNESIUM: Magnesium: 1.5 mg/dL — ABNORMAL LOW

## 2013-02-24 LAB — VANCOMYCIN, TROUGH: Vancomycin, Trough: 14 ug/mL (ref 10–20)

## 2013-02-24 LAB — EXPECTORATED SPUTUM ASSESSMENT W REFEX TO RESP CULTURE

## 2013-02-25 DIAGNOSIS — I359 Nonrheumatic aortic valve disorder, unspecified: Secondary | ICD-10-CM

## 2013-02-25 LAB — CULTURE, BLOOD (SINGLE)

## 2013-02-26 LAB — CREATININE, SERUM: Creatinine: 0.63 mg/dL (ref 0.60–1.30)

## 2013-02-27 LAB — BASIC METABOLIC PANEL
ANION GAP: 1 — AB (ref 7–16)
BUN: 18 mg/dL (ref 7–18)
Calcium, Total: 8.2 mg/dL — ABNORMAL LOW (ref 8.5–10.1)
Chloride: 91 mmol/L — ABNORMAL LOW (ref 98–107)
Co2: 40 mmol/L (ref 21–32)
Creatinine: 0.65 mg/dL (ref 0.60–1.30)
EGFR (African American): >60
Glucose: 155 mg/dL — ABNORMAL HIGH (ref 65–99)
OSMOLALITY: 270 (ref 275–301)
POTASSIUM: 3.6 mmol/L (ref 3.5–5.1)
Sodium: 132 mmol/L — ABNORMAL LOW (ref 136–145)

## 2013-02-27 LAB — PLATELET COUNT: Platelet: 101 10*3/uL — ABNORMAL LOW (ref 150–440)

## 2013-03-02 ENCOUNTER — Encounter: Payer: Self-pay | Admitting: Internal Medicine

## 2013-03-09 LAB — EXPECTORATED SPUTUM ASSESSMENT W REFEX TO RESP CULTURE

## 2013-03-10 LAB — CBC WITH DIFFERENTIAL/PLATELET
BASOS PCT: 0 %
Basophil #: 0 10*3/uL (ref 0.0–0.1)
Eosinophil #: 0 10*3/uL (ref 0.0–0.7)
Eosinophil %: 0 %
HCT: 23.1 % — ABNORMAL LOW (ref 35.0–47.0)
HGB: 7.7 g/dL — AB (ref 12.0–16.0)
LYMPHS PCT: 4.1 %
Lymphocyte #: 0.2 10*3/uL — ABNORMAL LOW (ref 1.0–3.6)
MCH: 29.8 pg (ref 26.0–34.0)
MCHC: 33.2 g/dL (ref 32.0–36.0)
MCV: 90 fL (ref 80–100)
Monocyte #: 0.2 x10 3/mm (ref 0.2–0.9)
Monocyte %: 3.3 %
NEUTROS PCT: 92.6 %
Neutrophil #: 4.7 10*3/uL (ref 1.4–6.5)
PLATELETS: 183 10*3/uL (ref 150–440)
RBC: 2.58 10*6/uL — ABNORMAL LOW (ref 3.80–5.20)
RDW: 21.8 % — AB (ref 11.5–14.5)
WBC: 5 10*3/uL (ref 3.6–11.0)

## 2013-03-10 LAB — BASIC METABOLIC PANEL
Anion Gap: 6 — ABNORMAL LOW (ref 7–16)
BUN: 27 mg/dL — ABNORMAL HIGH (ref 7–18)
CHLORIDE: 92 mmol/L — AB (ref 98–107)
Calcium, Total: 8.1 mg/dL — ABNORMAL LOW (ref 8.5–10.1)
Co2: 38 mmol/L — ABNORMAL HIGH (ref 21–32)
Creatinine: 0.84 mg/dL (ref 0.60–1.30)
EGFR (African American): >60
GLUCOSE: 159 mg/dL — AB (ref 65–99)
OSMOLALITY: 280 (ref 275–301)
Potassium: 3.5 mmol/L (ref 3.5–5.1)
Sodium: 136 mmol/L (ref 136–145)

## 2013-03-10 LAB — MAGNESIUM: Magnesium: 1 mg/dL — ABNORMAL LOW

## 2013-03-13 ENCOUNTER — Inpatient Hospital Stay: Payer: Self-pay | Admitting: Internal Medicine

## 2013-03-13 LAB — CBC
HCT: 26.5 % — AB (ref 35.0–47.0)
HGB: 8.7 g/dL — ABNORMAL LOW (ref 12.0–16.0)
MCH: 30.4 pg (ref 26.0–34.0)
MCHC: 33 g/dL (ref 32.0–36.0)
MCV: 92 fL (ref 80–100)
PLATELETS: 254 10*3/uL (ref 150–440)
RBC: 2.88 10*6/uL — ABNORMAL LOW (ref 3.80–5.20)
RDW: 21.9 % — ABNORMAL HIGH (ref 11.5–14.5)
WBC: 8.8 10*3/uL (ref 3.6–11.0)

## 2013-03-13 LAB — COMPREHENSIVE METABOLIC PANEL
ALBUMIN: 2.5 g/dL — AB (ref 3.4–5.0)
ALK PHOS: 76 U/L
Anion Gap: 2 — ABNORMAL LOW (ref 7–16)
BUN: 23 mg/dL — ABNORMAL HIGH (ref 7–18)
Bilirubin,Total: 0.3 mg/dL (ref 0.2–1.0)
CALCIUM: 8.5 mg/dL (ref 8.5–10.1)
CO2: 41 mmol/L — AB (ref 21–32)
CREATININE: 0.72 mg/dL (ref 0.60–1.30)
Chloride: 97 mmol/L — ABNORMAL LOW (ref 98–107)
EGFR (African American): >60
EGFR (Non-African Amer.): >60
GLUCOSE: 57 mg/dL — AB (ref 65–99)
OSMOLALITY: 281 (ref 275–301)
POTASSIUM: 3.8 mmol/L (ref 3.5–5.1)
SGOT(AST): 16 U/L (ref 15–37)
SGPT (ALT): 17 U/L (ref 12–78)
Sodium: 140 mmol/L (ref 136–145)
Total Protein: 6.8 g/dL (ref 6.4–8.2)

## 2013-03-13 LAB — DIFFERENTIAL
Basophil #: 0 10*3/uL (ref 0.0–0.1)
Basophil %: 0.1 %
Eosinophil #: 0 10*3/uL (ref 0.0–0.7)
Eosinophil %: 0.6 %
LYMPHS PCT: 6.7 %
Lymphocyte #: 0.6 10*3/uL — ABNORMAL LOW (ref 1.0–3.6)
MONOS PCT: 4.4 %
Monocyte #: 0.4 x10 3/mm (ref 0.2–0.9)
Neutrophil #: 7.7 10*3/uL — ABNORMAL HIGH (ref 1.4–6.5)
Neutrophil %: 88.2 %

## 2013-03-13 LAB — CK TOTAL AND CKMB (NOT AT ARMC)
CK, Total: 15 U/L — ABNORMAL LOW (ref 21–215)
CK-MB: 0.5 ng/mL (ref 0.5–3.6)

## 2013-03-13 LAB — RAPID INFLUENZA A&B ANTIGENS

## 2013-03-13 LAB — PRO B NATRIURETIC PEPTIDE: B-TYPE NATIURETIC PEPTID: 6303 pg/mL — AB (ref 0–125)

## 2013-03-13 LAB — TROPONIN I: Troponin-I: <0.02 ng/mL

## 2013-03-14 LAB — CREATININE, SERUM: Creatinine: 0.76 mg/dL (ref 0.60–1.30)

## 2013-03-15 LAB — VANCOMYCIN, TROUGH
VANCOMYCIN, TROUGH: 23 ug/mL — AB (ref 10–20)
Vancomycin, Trough: 27 ug/mL (ref 10–20)

## 2013-03-16 LAB — CBC WITH DIFFERENTIAL/PLATELET
Basophil #: 0 10*3/uL (ref 0.0–0.1)
Basophil %: 0.3 %
EOS PCT: 0.2 %
Eosinophil #: 0 10*3/uL (ref 0.0–0.7)
HCT: 23.3 % — ABNORMAL LOW (ref 35.0–47.0)
HGB: 7.7 g/dL — ABNORMAL LOW (ref 12.0–16.0)
Lymphocyte #: 0.5 10*3/uL — ABNORMAL LOW (ref 1.0–3.6)
Lymphocyte %: 8.8 %
MCH: 30.1 pg (ref 26.0–34.0)
MCHC: 33.1 g/dL (ref 32.0–36.0)
MCV: 91 fL (ref 80–100)
MONO ABS: 0.4 x10 3/mm (ref 0.2–0.9)
MONOS PCT: 6 %
Neutrophil #: 5.2 10*3/uL (ref 1.4–6.5)
Neutrophil %: 84.7 %
Platelet: 216 10*3/uL (ref 150–440)
RBC: 2.56 10*6/uL — ABNORMAL LOW (ref 3.80–5.20)
RDW: 21.8 % — AB (ref 11.5–14.5)
WBC: 6.2 10*3/uL (ref 3.6–11.0)

## 2013-03-16 LAB — BASIC METABOLIC PANEL
ANION GAP: 5 — AB (ref 7–16)
BUN: 19 mg/dL — AB (ref 7–18)
CHLORIDE: 90 mmol/L — AB (ref 98–107)
CO2: 39 mmol/L — AB (ref 21–32)
Calcium, Total: 8.1 mg/dL — ABNORMAL LOW (ref 8.5–10.1)
Creatinine: 0.81 mg/dL (ref 0.60–1.30)
EGFR (African American): >60
GLUCOSE: 114 mg/dL — AB (ref 65–99)
Osmolality: 271 (ref 275–301)
Potassium: 3.4 mmol/L — ABNORMAL LOW (ref 3.5–5.1)
Sodium: 134 mmol/L — ABNORMAL LOW (ref 136–145)

## 2013-03-16 LAB — VANCOMYCIN, TROUGH
VANCOMYCIN, TROUGH: 19 ug/mL (ref 10–20)
Vancomycin, Trough: 32 ug/mL (ref 10–20)

## 2013-03-18 LAB — CULTURE, BLOOD (SINGLE)

## 2013-03-26 ENCOUNTER — Ambulatory Visit: Payer: Self-pay | Admitting: Oncology

## 2013-03-27 ENCOUNTER — Inpatient Hospital Stay: Payer: Self-pay | Admitting: Internal Medicine

## 2013-03-27 LAB — COMPREHENSIVE METABOLIC PANEL
ALT: 14 U/L (ref 12–78)
ANION GAP: 1 — AB (ref 7–16)
Albumin: 1.9 g/dL — ABNORMAL LOW (ref 3.4–5.0)
Alkaline Phosphatase: 186 U/L — ABNORMAL HIGH
BILIRUBIN TOTAL: 0.5 mg/dL (ref 0.2–1.0)
BUN: 21 mg/dL — AB (ref 7–18)
CO2: 33 mmol/L — AB (ref 21–32)
Calcium, Total: 8.4 mg/dL — ABNORMAL LOW (ref 8.5–10.1)
Chloride: 92 mmol/L — ABNORMAL LOW (ref 98–107)
Creatinine: 0.84 mg/dL (ref 0.60–1.30)
EGFR (African American): >60
Glucose: 60 mg/dL — ABNORMAL LOW (ref 65–99)
Osmolality: 254 (ref 275–301)
Potassium: 4.5 mmol/L (ref 3.5–5.1)
SGOT(AST): 11 U/L — ABNORMAL LOW (ref 15–37)
Sodium: 126 mmol/L — ABNORMAL LOW (ref 136–145)
Total Protein: 6.2 g/dL — ABNORMAL LOW (ref 6.4–8.2)

## 2013-03-27 LAB — URINALYSIS, COMPLETE
BACTERIA: NONE SEEN
Bilirubin,UR: NEGATIVE
Blood: NEGATIVE
GLUCOSE, UR: NEGATIVE mg/dL (ref 0–75)
KETONE: NEGATIVE
Leukocyte Esterase: NEGATIVE
NITRITE: NEGATIVE
Ph: 5 (ref 4.5–8.0)
Protein: NEGATIVE
RBC,UR: <1 /HPF (ref 0–5)
Specific Gravity: 1.014 (ref 1.003–1.030)
Squamous Epithelial: NONE SEEN

## 2013-03-27 LAB — CBC WITH DIFFERENTIAL/PLATELET
Basophil #: 0 10*3/uL (ref 0.0–0.1)
Basophil %: 0.4 %
Eosinophil #: 0 10*3/uL (ref 0.0–0.7)
Eosinophil %: 0.5 %
HCT: 18.4 % — AB (ref 35.0–47.0)
HGB: 6.1 g/dL — ABNORMAL LOW (ref 12.0–16.0)
LYMPHS PCT: 5.1 %
Lymphocyte #: 0.3 10*3/uL — ABNORMAL LOW (ref 1.0–3.6)
MCH: 30.4 pg (ref 26.0–34.0)
MCHC: 33.1 g/dL (ref 32.0–36.0)
MCV: 92 fL (ref 80–100)
Monocyte #: 0.4 x10 3/mm (ref 0.2–0.9)
Monocyte %: 6.8 %
NEUTROS PCT: 87.2 %
Neutrophil #: 5.5 10*3/uL (ref 1.4–6.5)
Platelet: 98 10*3/uL — ABNORMAL LOW (ref 150–440)
RBC: 2.01 10*6/uL — ABNORMAL LOW (ref 3.80–5.20)
RDW: 21.7 % — ABNORMAL HIGH (ref 11.5–14.5)
WBC: 6.3 10*3/uL (ref 3.6–11.0)

## 2013-03-27 LAB — TROPONIN I: Troponin-I: <0.02 ng/mL

## 2013-03-27 LAB — CK TOTAL AND CKMB (NOT AT ARMC)
CK, Total: 11 U/L — ABNORMAL LOW (ref 21–215)
CK-MB: 0.5 ng/mL (ref 0.5–3.6)

## 2013-03-27 LAB — PROTIME-INR
INR: 1.1
PROTHROMBIN TIME: 14.1 s (ref 11.5–14.7)

## 2013-03-27 LAB — PRO B NATRIURETIC PEPTIDE: B-TYPE NATIURETIC PEPTID: 10148 pg/mL — AB (ref 0–125)

## 2013-03-27 LAB — APTT: ACTIVATED PTT: 52.3 s — AB (ref 23.6–35.9)

## 2013-03-28 LAB — BASIC METABOLIC PANEL
ANION GAP: 4 — AB (ref 7–16)
BUN: 17 mg/dL (ref 7–18)
CALCIUM: 8.6 mg/dL (ref 8.5–10.1)
CO2: 30 mmol/L (ref 21–32)
CREATININE: 0.58 mg/dL — AB (ref 0.60–1.30)
Chloride: 95 mmol/L — ABNORMAL LOW (ref 98–107)
EGFR (Non-African Amer.): >60
GLUCOSE: 86 mg/dL (ref 65–99)
Osmolality: 260 (ref 275–301)
Potassium: 4.5 mmol/L (ref 3.5–5.1)
SODIUM: 129 mmol/L — AB (ref 136–145)

## 2013-03-28 LAB — CBC WITH DIFFERENTIAL/PLATELET
Basophil #: 0 10*3/uL (ref 0.0–0.1)
Basophil %: 0.3 %
Eosinophil #: 0 10*3/uL (ref 0.0–0.7)
Eosinophil %: 0.5 %
HCT: 27.8 % — ABNORMAL LOW (ref 35.0–47.0)
HGB: 9.2 g/dL — AB (ref 12.0–16.0)
LYMPHS ABS: 0.2 10*3/uL — AB (ref 1.0–3.6)
Lymphocyte %: 3.4 %
MCH: 30.2 pg (ref 26.0–34.0)
MCHC: 33.2 g/dL (ref 32.0–36.0)
MCV: 91 fL (ref 80–100)
MONOS PCT: 6.9 %
Monocyte #: 0.5 x10 3/mm (ref 0.2–0.9)
NEUTROS PCT: 88.9 %
Neutrophil #: 6.4 10*3/uL (ref 1.4–6.5)
PLATELETS: 112 10*3/uL — AB (ref 150–440)
RBC: 3.06 10*6/uL — ABNORMAL LOW (ref 3.80–5.20)
RDW: 19.7 % — ABNORMAL HIGH (ref 11.5–14.5)
WBC: 7.2 10*3/uL (ref 3.6–11.0)

## 2013-03-28 LAB — MAGNESIUM: Magnesium: 1.7 mg/dL — ABNORMAL LOW

## 2013-03-29 LAB — BASIC METABOLIC PANEL
Anion Gap: 5 — ABNORMAL LOW (ref 7–16)
BUN: 19 mg/dL — ABNORMAL HIGH (ref 7–18)
CREATININE: 0.72 mg/dL (ref 0.60–1.30)
Calcium, Total: 9 mg/dL (ref 8.5–10.1)
Chloride: 93 mmol/L — ABNORMAL LOW (ref 98–107)
Co2: 27 mmol/L (ref 21–32)
EGFR (African American): >60
Glucose: 113 mg/dL — ABNORMAL HIGH (ref 65–99)
Osmolality: 255 (ref 275–301)
POTASSIUM: 5.2 mmol/L — AB (ref 3.5–5.1)
Sodium: 125 mmol/L — ABNORMAL LOW (ref 136–145)

## 2013-03-29 LAB — CBC WITH DIFFERENTIAL/PLATELET
BASOS ABS: 0 10*3/uL (ref 0.0–0.1)
Basophil %: 0.4 %
EOS ABS: 0.1 10*3/uL (ref 0.0–0.7)
Eosinophil %: 0.7 %
HCT: 30.4 % — ABNORMAL LOW (ref 35.0–47.0)
HGB: 10 g/dL — AB (ref 12.0–16.0)
Lymphocyte #: 0.6 10*3/uL — ABNORMAL LOW (ref 1.0–3.6)
Lymphocyte %: 5.7 %
MCH: 30.3 pg (ref 26.0–34.0)
MCHC: 33 g/dL (ref 32.0–36.0)
MCV: 92 fL (ref 80–100)
MONO ABS: 0.8 x10 3/mm (ref 0.2–0.9)
MONOS PCT: 8.5 %
Neutrophil #: 8.4 10*3/uL — ABNORMAL HIGH (ref 1.4–6.5)
Neutrophil %: 84.7 %
PLATELETS: 138 10*3/uL — AB (ref 150–440)
RBC: 3.3 10*6/uL — AB (ref 3.80–5.20)
RDW: 19.2 % — AB (ref 11.5–14.5)
WBC: 9.9 10*3/uL (ref 3.6–11.0)

## 2013-03-29 LAB — URINE CULTURE

## 2013-03-30 ENCOUNTER — Institutional Professional Consult (permissible substitution): Payer: Medicare Other | Admitting: Pulmonary Disease

## 2013-03-31 LAB — EXPECTORATED SPUTUM ASSESSMENT W GRAM STAIN, RFLX TO RESP C

## 2013-04-23 ENCOUNTER — Ambulatory Visit: Payer: Self-pay | Admitting: Oncology

## 2013-04-23 DEATH — deceased

## 2014-06-15 NOTE — H&P (Signed)
PATIENT NAME:  Patricia Downs, [name] MR#:  [id number] DATE OF BIRTH:  September 16, 1945  DATE OF ADMISSION:  02/03/2013  PRIMARY CARE PHYSICIAN: Dr. Terance HartBronstein  REFERRING PHYSICIAN:  Dr. Cyril LoosenKinner    CHIEF COMPLAINT: Shortness of breath, cough, wheezing for 2 days.   HISTORY OF PRESENT ILLNESS: A 69 year old Caucasian female with a history of pneumonia, hypertension, diabetes, CHF, lung cancer, breast cancer, was sent to ED due to shortness of breath, cough, wheezing for 2 days. The patient is alert, awake, oriented. According to patient and her son, patient is status post chemotherapy 5 days ago. Then patient developed shortness of breath, cough, wheezing for the past 2 days. The symptoms have been worsening today. The patient developed respiratory distress, had a fever, chills and weakness.  The patient was sent to ED by EMS. She was noted to have a low blood pressure at the 70s. The patient  was treated with normal saline bolus. Blood pressure is still at 70s. In addition, patient's chest x-ray showed pneumonia, and hemoglobin is only 6.7. The patient denies any chest pain, palpitation, orthopnea, or nocturnal dyspnea. No leg edema. Denies any melena or bloody stool. No dysuria or hematuria. No easy bleeding.   PAST MEDICAL HISTORY: Hypertension, diabetes, CHF, CAD status post stent, breast cancer, lung cancer, hypothyroidism, pneumonia.   PAST SURGICAL HISTORY:  Bilateral mastectomy, hysterectomy, mitral valve repair.   SOCIAL HISTORY:  Quit smoking 10 years ago. No alcohol drinking or illicit drugs.   FAMILY HISTORY:  Niece has breast cancer.   ALLERGIES:  CEPHALOSPORINS and TAPE.  HOME MEDICATIONS:   1.  Tussionex. 2.  Pennkinetic 8 mg - 10 mg per 5 mL suspension, 5 mL oral every 12 hours p.r.n.   3.  Potassium 20 mEq once a daily.  4.  Zofran 8 mg p.o. t.i.d. p.r.n. for nausea.  5.  Metformin 1000 mg p.o. b.i.d.  6.  Lortab 10/325, 1 tab every 6 hours p.r.n.  7.  Levothyroxine 100 mcg p.o. daily.   8.  Levaquin 500 mg p.o. q.  24 hours.  9.  Glyburide 2 mg p.o. daily at bedtime.  10.  Lasix 20 mg p.o. daily.  11.  Fentanyl patch 50 mcg every 72 hours.  12.  Dulcolax laxative 5 mg p.o. daily.  13.  Dilaudid 4 mg p.o. 4 times a day p.r.n. for pain.  14.  Digoxin 125 mcg p.o. daily.  15.  Diazepam 10 mg p.o. b.i.d.  16.  Compazine tablets 10 mg p.o. every 6 hours p.r.n.  17.  Combivent CFC 1 puff 4 times a day.  18.  Colace 100 mg p.o. b.i.d.  19.  Coreg 25 mg p.o. b.i.d.  20.  Aspirin 325 mg p.o. daily.  21.  Ambien 10 mg p.o. at bedtime.  22.  Advair Diskus 250 mcg/50 mcg inhalation 1 puff b.i.d.   REVIEW OF SYSTEMS:   CONSTITUTIONAL: The patient has a fever, chills. No headache or dizziness, but has weakness. EYES: No double vision or blurry vision.  ENT: No postnasal drip, slurred speech, or dysphagia.  CARDIOVASCULAR: No chest pain, palpitations, orthopnea, nocturnal dyspnea. No leg edema.  PULMONARY: Positive for cough, sputum, shortness of breath, but no hemoptysis.  GASTROINTESTINAL: No abdominal pain, but has nausea. No vomiting or diarrhea. No melena or bloody stool.  GENITOURINARY: No dysuria, hematuria, or incontinence.  SKIN: No rash or jaundice.  NEUROLOGY: No syncope, loss of consciousness, or seizure.   HEMATOLOGIC: No easy bruising or bleeding.  ENDOCRINE:  No polyuria, polydipsia, heat or cold intolerance.   VITAL SIGNS: Temperature 98.3, blood pressure 71/55, pulse 93, O2 saturation 100% on Ventimask while on BiPAP.   PHYSICAL EXAMINATION: GENERAL: The patient is alert, awake, lethargic.  HEENT: Pupils round, equal, react to light and accommodation. Very dry oral mucosa. Red oropharynx.  NECK: Supple. No JVD or carotid bruits. No lymphadenopathy. No thyromegaly.   CARDIOVASCULAR: S1, S2. Regular rate and rhythm. No murmurs, gallops.  PULMONARY: Bilateral air entry. Bilateral severe crackles and wheezing on both sides. Mild use of accessory muscle to breathe.   ABDOMEN: Soft, obese. Bowel sounds present. No distention or tenderness. No organomegaly.  EXTREMITIES: No edema, clubbing or cyanosis. No calf tenderness. Bilateral pedal pulses present.  SKIN: No rash or jaundice.  NEUROLOGIC: Alert and oriented x 3. No focal deficit. Power 4/5. Sensation intact.  LABORATORY DATA:  ABG showed pH of 7.31, pCO2 of 46, pO2 of 100, with FiO2 of 28. Chest x-ray shows new bilateral perihilar opacity. WBC 11.1, hemoglobin 6.7, platelets 99. Glucose 122, BUN 21, creatinine 1.21, sodium 131, potassium 4.3, chloride 98, bicarb 24. Troponin less than 0.02. BNP 4734. Lactic acid 2.1. EKG showed accelerated junctional rhythm at 108 BPM.   IMPRESSIONS: 1.  Septic shock.  2.  Bilateral pneumonia.  3. Acute respiratory failure. 4.  Anemia.  5.  Thrombocytopenia.  6.  Dehydration.  7.  Hyponatremia.  8.  Lactic acidosis.  8.  Diabetes.  9.  History of hypertension, congestive heart failure, coronary artery disease, breast cancer, lung cancer, and hypothyroidism.   PLAN OF TREATMENT: 1.  The patient will be admitted to CCU. We will continue tele monitor. Continue normal saline IV fluid support. Will start Levophed. Follow up with blood culture, CBC, sputum culture, and follow up BMP.  2.  We will start meropenem, Zithromax and vancomycin.  3.  We will hold the Lasix and Coreg due to hypotension.  4.  We will hold aspirin, due to anemia, will give PRBC transfusion. The patient is being treated with PRBC transfusion now. Follow up hemoglobin in the morning.  5.  For diabetes, we will start a sliding scale. Hold p.o. medication. 6. GI prophylaxis with Protonix, deep vein thrombosis prophylaxis with TEDs due to thrombocytopenia and anemia.   Discussed the patient's critical condition with patient and patient's son. The patient wanted  FULL CODE.   Critical TIME SPENT:  About 66 minutes.     ____________________________ Shaune Pollack, MD qc:mr D: 02/03/2013 21:24:11  ET T: 02/03/2013 21:43:59 ET JOB#: [id number]  cc: Shaune Pollack, MD, <Dictator> Shaune Pollack MD ELECTRONICALLY SIGNED 02/06/2013 14:54

## 2014-06-15 NOTE — Consult Note (Signed)
Psychiatry: followup patient seen in CCU who was delirious at the time. Today she is awake and alert and oriented and able to engage in conversation. Denies any hallucinations. Sister confirms this. Affect euthymic. Patient not aware of having any confusion. Overall much better and at least at this time not delirious.I see that geodon im was decreased to 5mg  q8. I think at this point we should try stopping it and replacing it with a low dose of risperdal just at night to prevent nighttime confusion. Continue prn haldol. If she gets delirious again can restart geodon im.  Electronic Signatures: Germaine Shenker, Jackquline DenmarkJohn T (MD)  (Signed on 19-Dec-14 15:35)  Authored  Last Updated: 19-Dec-14 15:35 by Audery Amellapacs, Janey Petron T (MD)

## 2014-06-15 NOTE — Consult Note (Signed)
History of Present Illness:  Reason for Consult Lung cancer with recent chemotherapy, now with acute on chronic heart failure and pneumonia.  She was also found to be significantly anemic.   HPI   Patient admitted to the hospital yesterday with worsening shortness of breath.  She was found to have acute on chronic heart failure as well as a postobstructive pneumonia.  Since initiating antibiotics and receiving Lasix, patient feels significantly better.  She still has shortness of breath but is nearly back to her baseline.  She continues to have a chronic cough.  She denies any chest pain.  She has no neurologic complaints.  She did not complain of nausea, vomiting, constipation, or diarrhea today.  She has no urinary complaints.  Patient otherwise feels well and offers no further specific complaints.  PFSH:  Additional Past Medical and Surgical History Bilateral mastectomies: 1979 right-sided breast cancer status post XRT, 1998 left-sided breast cancer status post Adriamycin and Cytoxan and XRT.  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  GERD, CHF, hypothyroidism, diabetes, hypertension, total hysterectomy.  Family history: Sister with breast cancer who is also BRCA positive it hypertension.  Social history: Positive tobacco, with 1.5 packs per day x30 years.  Patient was offered smoking cessation.  Occasional alcohol use.   Review of Systems:  Performance Status (ECOG) 2   Review of Systems   As per HPI. Otherwise, 10 point system review was negative.   NURSING NOTES: **Vital Signs.:   30-Dec-14 11:02   Vital Signs Type: Routine   Temperature Temperature (F): 98.1   Celsius: 36.7   Temperature Source: oral   Pulse Pulse: 79   Respirations Respirations: 17   Systolic BP Systolic BP: 409   Diastolic BP (mmHg) Diastolic BP (mmHg): 67   Mean BP: 78   Pulse Ox % Pulse Ox %: 98   Pulse Ox Activity Level: At rest   Oxygen Delivery: 4L    Physical Exam:  Physical Exam General: Ill-appearing, no acute distress. Eyes: Pink conjunctiva, anicteric sclera. Lungs: wheezing and rhonchi throughout bilaterally Heart: Regular rate and rhythm. No rubs, murmurs, or gallops. Abdomen: Soft, nontender, nondistended. No organomegaly noted, normoactive bowel sounds. Musculoskeletal: No edema, cyanosis, or clubbing. Neuro: Alert, answering all questions appropriately. Cranial nerves grossly intact. Skin: No rashes or petechiae noted. Psych: Normal affect.    Cephalosporins: Rash  Tape: Other    predniSONE 10 mg oral tablet: 1 tab(s) orally once a day, Status: Active, Quantity: 8, Refills: None   Dilaudid 4 mg oral tablet: 1 tab(s) orally every 6 hours, As Needed, Status: Active, Quantity: 120, Refills: None   diazepam 10 mg oral tablet: 1 tab(s) orally 2 times a day, As Needed - for Anxiety, Nervousness, Status: Active, Quantity: 60, Refills: None   Lortab 10/325: 1 tab(s) po every 6 hours, As Needed - for Pain, Status: Active, Quantity: 40, Refills: None   fentaNYL 50 mcg/hr transdermal film, extended release: 1 patch transdermal every 72 hours, Status: Active, Quantity: 10, Refills: None   acetaminophen 325 mg oral tablet: 2 tab(s) orally every 4 hours, As needed, pain or temp. greater than 100.4, Status: Active, Quantity: 0, Refills: None   risperiDONE 0.25 mg oral tablet: 1 tab(s) orally once a day (at bedtime), Status: Active, Quantity: 0, Refills: None   magnesium oxide 400 mg (241.3 mg elemental magnesium) oral tablet: 1 tab(s) orally 2 times a day, Status: Active, Quantity: 0, Refills: None   chlorpheniramine-HYDROcodone 8 mg-10 mg/5 mL  oral suspension, extended release: 5 milliliter(s) orally every 6 hours, As needed, cough, Status: Active, Quantity: 0, Refills: None   ondansetron 8 mg oral tablet: 1 tab(s) orally every 8 hours, As Needed, Status: Active, Quantity: 90, Refills: 1   Advair Diskus 250 mcg-50 mcg  inhalation powder: 1 puff(s) inhaled 2 times a day, Status: Active, Quantity: 60, Refills: None   Combivent Respimat CFC free 100 mcg-20 mcg/inh inhalation aerosol: 1 puff(s) inhaled 4 times a day, Status: Active, Quantity: 120, Refills: None   furosemide 20 mg oral tablet: 1 tab(s) orally once a day, Status: Active, Quantity: 0, Refills: None   Colace sodium 100 mg oral capsule: 1 cap(s) orally 2 times a day, Status: Active, Quantity: 0, Refills: None   Dulcolax Laxative 5 mg oral delayed release tablet: 1 tab(s) orally once a day, Status: Active, Quantity: 0, Refills: None   digoxin 125 mcg (0.125 mg) oral tablet: 1 tab(s) orally once a day (in the morning), Status: Active, Quantity: 0, Refills: None   glimepiride 2 mg oral tablet: 1 tab(s) orally once a day (at bedtime), Status: Active, Quantity: 0, Refills: None   levothyroxine 100 mcg (0.1 mg) oral tablet: 1 tab(s) orally once a day (in the morning), Status: Active, Quantity: 0, Refills: None   metFORMIN 1000 mg oral tablet: 1 tab(s) orally 2 times a day, Status: Active, Quantity: 0, Refills: None   aspirin 325 mg oral tablet: 1 tab(s) orally once a day, Status: Active, Quantity: 0, Refills: None   carvedilol 25 mg oral tablet: 1 tab(s) orally 2 times a day, Status: Active, Quantity: 0, Refills: None   potassium chloride 20 mEq oral tablet, extended release: 1 tab(s) orally once a day, Status: Active, Quantity: 0, Refills: None  Laboratory Results:  Routine Chem:  30-Dec-14 06:26   Magnesium, Serum  0.6 (1.8-2.4 THERAPEUTIC RANGE: 4-7 mg/dL TOXIC: > 10 mg/dL  -----------------------)  Cardiac:  30-Dec-14 06:26   Troponin I < 0.02 (0.00-0.05 0.05 ng/mL or less: NEGATIVE  Repeat testing in 3-6 hrs  if clinically indicated. >0.05 ng/mL: POTENTIAL  MYOCARDIAL INJURY. Repeat  testing in 3-6 hrs if  clinically indicated. 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 Plan:   1.  Shortness breath: Multifactorial secondary to pneumonia and acute on chronic heart failure. Lung cancer: Patient received her last infusion of chemotherapy with carboplatinum and Taxol on January 30, 2013.  She is scheduled for a restaging PET scan in mid January with followup 1-2 days later.  Patient has been instructed to keep these appointments as scheduled. Pneumonia: Continue current antibiotics. Heart failure: Continue Lasix. Anemia:  Likely secondary to chemotherapy.  Improved with 2 units of packed red blood cells.  Patient does not require transfusion at this time. Thrombocytopenia: Secondary chemotherapy.  Improving, no  intervention needed at this time. Hypomagnesemia: Replace has indicated. Pain: Continue current narcotic regimen with fentanyl patch and Dilaudid. consult, will follow.  Electronic Signatures: Delight Hoh (MD)  (Signed 07-Jan-15 15:56)  Authored: HISTORY OF PRESENT ILLNESS, PFSH, ROS, NURSING NOTES, PE, ALLERGIES, HOME MEDICATIONS, LABS, ASSESSMENT AND PLAN, CC Referring Physician   Last Updated: 07-Jan-15 15:56 by Delight Hoh (MD)

## 2014-06-15 NOTE — Discharge Summary (Signed)
PATIENT NAME:  Patricia Downs, Patricia Downs MR#:  [id number] DATE OF BIRTH:  Oct 01, 1945  DATE OF ADMISSION:  02/03/2013 DATE OF DISCHARGE:  02/13/2013  Please refer to the detailed discharge summary dictated by Dr. Sherryll BurgerShah 3 days ago.   DISCHARGE DIAGNOSES: 1.  Septic shock.  2.  Acute on chronic respiratory failure.  3.  Bilateral pneumonia.  4.  Acute renal failure.  5.  Dehydration.  6.  Anemia.  7.  Thrombocytopenia.  8.  Diabetes.  9.  Lung cancer.  10.  History of congestive heart failure.  CONDITION: Stable.   CODE STATUS: FULL CODE.    HOME MEDICATIONS: [facility] and medication reconciliation list. The patient needs home oxygen, 3 liters by nasal cannula.   DIET: Low sodium, low fat, low cholesterol, ADA diet.   ACTIVITY: As tolerated.   FOLLOWUP CARE: Follow up with PCP within 1 to 2 weeks. Follow up with Dr. Orlie DakinFinnegan within 1 to 2 weeks.   HOSPITAL COURSE: Please refer to the admission dictation and discharge summary dictated by Dr. Sherryll BurgerShah.  1.  Acute respiratory failure, improved after extubation. The patient has been on 3 liters oxygen by nasal cannula. She has been treated with nebulizer.  2.  Septic shock, likely due to pneumonia, improved.  3.  Bilateral pneumonia, has improved, off antibiotics.  4.  Anemia due to chemotherapy, status post transfusion, stable.  5.  Thrombocytopenia due to chemotherapy.  6.  Acute renal failure. She underwent physical therapy evaluation.   She needs skilled nursing facility placement. The patient has multiple medical problems including lung cancer, respiratory failure, pneumonia, but the patient is alert, awake. She wants FULL CODE. She will be discharged to a skilled nursing facility today. I discussed the patient's discharge plan with the patient, the patient's sister, Child psychotherapistsocial worker and case Production designer, theatre/television/filmmanager.   TIME SPENT: About 42 minutes.   ____________________________ Shaune PollackQing Madison Direnzo, MD qc:dmm D: 02/13/2013  10:21:00 ET Downs: 02/13/2013 10:38:47 ET JOB#: [id number]  cc: Shaune PollackQing Raydan Schlabach, MD, <Dictator> Shaune PollackQING Macallan Ord MD ELECTRONICALLY SIGNED 02/13/2013 15:48

## 2014-06-15 NOTE — Consult Note (Signed)
Brief Consult Note: Diagnosis: delirium, medical, multiffactor.   Patient was seen by consultant.   Consult note dictated.   Orders entered.   Comments: Psychiatry: PAtient seen and chart reviewed. Patient is delirious and confused. The change to geodon im seems like a reasonable one if the haldol wasn't holding her. It may not be possible to fully reverse delirium in her condition but medication can perhaps keep her as comfotable as possible. I'll order geodon 10mg  im bid plus the iv haldol as needed to keep her unagitated.  Electronic Signatures: Leigh Blas, Jackquline DenmarkJohn T (MD)  (Signed 17-Dec-14 22:41)  Authored: Brief Consult Note   Last Updated: 17-Dec-14 22:41 by Audery Amellapacs, Akeela Busk T (MD)

## 2014-06-15 NOTE — H&P (Signed)
PATIENT NAME:  Patricia Downs, [name] MR#:  [id number] DATE OF BIRTH:  1945-08-15  DATE OF ADMISSION:  02/20/2013  PRIMARY CARE PHYSICIAN: Teena Iraniavid M. Terance HartBronstein, MD  REFERRING PHYSICIAN: Dorothea GlassmanPaul Malinda, MD  CHIEF COMPLAINT: Weakness, shortness of breath, wheezing for 2 days.   HISTORY OF PRESENT ILLNESS: This is a 69 year old Caucasian female with a past history of lung cancer, CHF, pneumonia, who presented to the ED with the above chief complaint. The patient is alert, awake, oriented, in no acute distress. The patient was just discharged from our hospital 1 week ago after treatment of respiratory failure, pneumonia, CHF. During previous hospitalization, the patient was intubated due to respiratory failure and extubated. The patient was treated with antibiotics for pneumonia. After discharge, the patient was fine until 2 days ago. The patient started to have shortness of breath, wheezing and generalized weakness, which has been worsening. In addition, the patient has a cough, sputum and wheezing, but she denies any fever or chills. No headache or dizziness. The patient has some mild orthopnea and nocturnal dyspnea, but the patient denies any melena or bloody stool. The patient complains of chronic pain, which is not treated properly. She says she needs to have pain medication adjusted. In ED, the patient was noted to have a chest x-ray that showed right-sided opacity. BNP 15,630. Hemoglobin 6.4.   PAST MEDICAL HISTORY: Hypertension, diabetes, CHF, CAD, status post stent, breast cancer, lung cancer, hypothyroidism, and pneumonia on home oxygen.   PAST SURGICAL HISTORY: Bilateral mastectomies, hysterectomy, mitral valve repair.   SOCIAL HISTORY: Quit smoking 10 years ago. No alcohol drinking or illicit drugs. Living in  nursing home.   FAMILY HISTORY: Niece has breast cancer.   REVIEW OF SYSTEMS:    CONSTITUTIONAL: The patient denies any fever or chills. No headache or dizziness, but has generalized  weakness.  EYES: No double vision, blurry vision.  ENT: No postnasal drip, slurred speech or dysphagia.  CARDIOVASCULAR: No chest pain or palpitations, but has orthopnea, nocturnal dyspnea and leg edema.  PULMONARY: Positive for cough, sputum, wheezing, shortness of breath. No hemoptysis.  GASTROINTESTINAL: No abdominal pain, vomiting, diarrhea. No melena or bloody stool, but has nausea.  NEUROLOGIC: No syncope, loss of consciousness or seizure.  SKIN: No rash or jaundice.  HEMATOLOGIC: No easy bruising or bleeding.  ENDOCRINE: No polyuria, polydipsia, heat or cold intolerance.   ALLERGIES: CEPHALOSPORINS AND TAPE.   HOME MEDICATIONS: 1.  Risperidone 0.25 mg p.o. daily at bedtime. 2.  Prednisone 10 mg p.o. daily. 3.  Potassium 20 mEq once a day. 4.  Zofran 8 mg p.o. every 8 hours p.r.n.  5.  Metformin 1000 mg p.o. b.i.d.  6.  Magnesium oxide 400 mg p.o. b.i.d.  7.  Lortab 10/325, 1 tab every 6 hours p.r.n.  8.  Levothyroxine 100 mcg p.o. daily. 9.  Glyburide 2 mg p.o. daily at bedtime.  10.  Lasix 20 mg p.o. daily.  11.  Fentanyl patch 50 mcg, 1 patch transdermal every 72 hours.  12.  Dulcolax laxative 5 mg p.o. daily.  13.  Dilaudid 4 mg p.o. every 6 hours p.r.n.  14.  Digoxin 125 mcg 1 tablet once a day in the morning.  15.  Diazepam 10 mg p.o. b.i.d.  16.  Combivent CFC 100 mcg/20 mcg inhalation, 1 puff 4 times a day. 17.  Colace 100 mg b.i.d.  18.  Chlorpheniramine/hydrocodone 8mg /10 mg oral suspension, 5 mL every 6 hours p.r.n. for cough.  19.  Coreg 25 mg p.o.  b.i.d.  20.  Aspirin 325 mg p.o. daily.  21.  Advair 250 mcg/50 mcg inhalation powder, 1 puff twice a day.  22.  Acetaminophen 325 mg p.o. 2 tablets every 4 hours p.r.n.    PHYSICAL EXAMINATION: VITAL SIGNS: Temperature 97.9, blood pressure 109/66, pulse 77, respirations 20, O2 saturation 99% on oxygen.  GENERAL: The patient is alert, awake, oriented, in no acute distress.  HEENT: Pupils round, equal, reactive to  light and accommodation. Moist oral mucosa. Clear oropharynx.  NECK: Supple. No JVD or carotid bruits. No lymphadenopathy. No thyromegaly.  CARDIOVASCULAR: S1, S2, regular rate and rhythm. No murmurs or gallops.  PULMONARY: Bilateral air entry. No wheezing, but has coarse breath sounds and weak breath sounds and some crackles on the right side. No use of accessory muscles to breathe.  ABDOMEN: Soft. No distention or tenderness. No organomegaly. Bowel sounds present.  EXTREMITIES: Bilateral leg edema, 1 to 2+. No clubbing or cyanosis. No calf tenderness. Bilateral pedal pulses present.  SKIN: No rash or jaundice.  NEUROLOGIC: A and O x 3. No focal deficit. Power 4 out of 5. Sensation intact.   LABORATORY, DIAGNOSTIC AND RADIOLOGICAL DATA: Chest x-ray showed new subtotal opacification in the right lung since 9 days ago, favor postobstructive lung or pneumonia related to right hilar obstruction.  BNP 15,630. WBC 4.9, hemoglobin 6.4, platelets 59. Glucose 47, BUN 12, creatinine 0.57, sodium 129, potassium 4.4, chloride 89. Troponin less than 0.02.   EKG shows sinus rhythm with marked sinus arrhythmia with first degree AV block at 71 bpm.   IMPRESSIONS: 1.  Acute on chronic respiratory failure.  2.  Acute on chronic congestive heart failure.  3.  Hyponatremia.  4.  Anemia.  5.  Pneumonia.  6.  Lung cancer with obstruction.  7.  Thrombocytopenia, possibly due to chemotherapy.  8.  Coronary artery disease.  9.  Diabetes.   PLAN OF TREATMENT: 1.  The patient will be admitted to telemetry floor. We will continue O2 by nasal cannula,  nebulizer treatment, Lasix 40 mg IV q.12 hours. Start CHF protocol. 2.  For the anemia, we will give PRBC 2 units transfusion. Follow up hemoglobin.  3.  For pneumonia, we will start meropenem, Levaquin, and follow up blood culture, CBC, sputum culture.  4.  Fall and aspiration precautions. PT evaluation. 5.  For diabetes, the patient has low blood  sugar/hypoglycemia. We will hold glyburide and start sliding scale.  6.  Also, we will get an oncology consult Dr. Orlie Dakin.  7.  I discussed the patient's condition and plan of treatment with the patient and the patient's sister. The patient wants full code.   TIME SPENT: About 66 minutes.    ____________________________ Shaune Pollack, MD qc:jcm D: 02/20/2013 13:15:26 ET T: 02/20/2013 13:55:36 ET JOB#: [id number]  cc: Shaune Pollack, MD, <Dictator> Shaune Pollack MD ELECTRONICALLY SIGNED 02/20/2013 21:07

## 2014-06-15 NOTE — Consult Note (Signed)
Reason for Visit: This 69 year old Female patient presents to the clinic for initial evaluation of  lung cancer .   Referred by Dr. Faith Rogue.  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  Imaging Report CT scans reviewed   Referral Report clinical notes reviewed   Planned Treatment Regimen probable combined modality IMRT treatment along with chemotherapy   HPI   patient is a 69 year old female with history of bilateral breast cancer and aortic valve replacement who presented over the past several months with increasing weakness. She was seen for cardiac workup chest x-ray and CT scan showed a large right hilar mass with invasion directly into the mediastinum. Also probable mediastinal adenopathy by CT criteria. She is seen today by medical oncology and surgical oncology. Not thought to be a surgical candidate. At this time PET/CT scan has been ordered, also biopsy by bronchoscopy is also been ordered. She is having a mild productive cough one episode of hemoptysis. She does have increasing shortness of breath.  Past Hx:    GERD:    cardiac stent placement:    CHF:    Hypothyroidism:    bilateral breast cancer: initially dx at age 61 and then reoccured at age 80   heart disease:    diabetes:    hypertension:    lung mass:    total hysterectomy:    bilateral mastectomy:   Past, Family and Social History:  Past Medical History positive   Cardiovascular congestive heart failure; coronary artery disease; coronary artery stents; hyperlipidemia; hypertension   Gastrointestinal GERD   Endocrine diabetes mellitus; hypothyroidism   Past Surgical History bilateral breast cancerwith bilateral mastectomies radiation to her right chest wall prior   Family History positive   Family History Comments sister with BRCA1 positive breast cancer   Social History positive   Social History  Comments 30-pack-year smoking history no EtOH abuse history   Additional Past Medical and Surgical History accompanied by multiple family members today   Allergies:   Cephalosporins: Rash  Tape: Other  Home Meds:  Home Medications: Medication Instructions Status  Tussionex [employer] 8 mg-10 mg/5 mL suspension, extended release 5 milliliter(s) orally every 12 hours, As Needed Active  carvedilol 25 mg oral tablet 1 tab(s) orally 2 times a day Active  metFORMIN 1000 mg oral tablet 1 tab(s) orally 2 times a day Active  allopurinol 100 mg oral tablet 1 tab(s) orally once a day (at bedtime) Active  aspirin 325 mg oral tablet 1 tab(s) orally once a day (in the morning) Active  Bydureon 2 mg subcutaneous powder for injection, extended release 1 dose(s) subcutaneous once a week on Monday Active  clopidogrel 75 mg oral tablet 1 tab(s) orally once a day (at bedtime) Active  digoxin 125 mcg (0.125 mg) oral tablet 1 tab(s) orally once a day (in the morning) Active  furosemide 20 mg oral tablet 1 tab(s) orally 2 times a day Active  glimepiride 2 mg oral tablet 1 tab(s) orally once a day (at bedtime) Active  levothyroxine 100 mcg (0.1 mg) oral tablet 1 tab(s) orally once a day (in the morning) Active  Lortab 10/325 325 mg-10 mg oral tablet 1 tab(s) orally every 8 hours, As Needed Active   Review of Systems:  General negative   Performance Status (ECOG) 0   Skin negative   Breast see HPI   Ophthalmologic negative   ENMT negative  Respiratory and Thorax negative   Cardiovascular see HPI   Gastrointestinal negative   Genitourinary negative   Musculoskeletal negative   Neurological negative   Psychiatric negative   Hematology/Lymphatics negative   Endocrine negative   Allergic/Immunologic negative   Physical Exam:  General/Skin/HEENT:  General normal   Skin normal   Eyes normal   ENMT normal   Head and Neck normal   Additional PE well-developed female appears  rather lethargic in NAD. Lungs are clear to A&P cardiac examination shows regular rate and rhythm. No evidence of venous jugular distention or collateral venous circulation in the anterior chest wall is noted. She is status post bilateral mastectomies. No evidence of mass or nodularity the chest wall is appreciated. No axillary or supraclavicular adenopathy is noted.   Breasts/Resp/CV/GI/GU:  Respiratory and Thorax normal   Cardiovascular normal   Gastrointestinal normal   Genitourinary normal   MS/Neuro/Psych/Lymph:  Musculoskeletal normal   Neurological normal   Lymphatics normal   Other Results:  Radiology Results: LabUnknown:    30-Jul-14 08:22, CT Chest With Contrast  PACS Image     11-Aug-14 14:15, PET/CT Scan Lung Cancer Diagnosis  PACS Image   CT:    30-Jul-14 08:22, CT Chest With Contrast  CT Chest With Contrast   REASON FOR EXAM:    LABS 1ST hemoptysis   Pulmonary [place] Protocol    Diabetic  COMMENTS:       PROCEDURE: KCT - KCT CHEST WITH CONTRAST  - Sep 21 2012  8:22AM     RESULT:  Comparison: None    Technique: Multiple thin section axial images were obtained from the lung   apices to the upper abdomen following 80 ml Isovue 370 intravenous   contrast, according to the PE protocol. These images were also reviewed   on a Siemens multiplanar work station.    Findings:  There is a large right superior paramediastinal mass which is likely     originating from the right upper lobe. It measures approximately 7.6 x   4.8 cm in greatest axial dimension. It is inseparable from the   mediastinum, including inseparable from the distal trachea. The mass   causes narrowing of the right mainstem bronchus. This mass also causes   narrowing of the right upper lobe pulmonary artery. It extends to the   right hilum. The subcarinal lymph node is mildly enlarged. There is a   mildly prominent, but not pathologically enlarged, prevascular lymph   node. Calcifications are  seen in the coronary arteries. There is a small   fat-containing ventral abdominal hernia in the superior abdomen.    The thoracic aorta is normal in caliber. No pulmonary embolus identified.    There are several small nodular densities in the right upper lobe   adjacent to the dominant mass. There are a few areas of scattered   groundglass opacities in the medial aspect of the right upper lobe     adjacent to the mass, which are nonspecific. There is a 7 mm nodule in   the anterior right middle lobe. Minimal opacities in the medial aspect of   the right lower lobe may be secondary to atelectasis. There is a 3 mm   nodule along the left major fissure, image 60. There is a 5 mm nodule in   the left upper lobe, image 33.    6 mm nodule in the subcutaneous fat of the lateral left thorax is   nonspecific, image 69.    No  aggressive lytic or sclerotic osseous lesions are identified.    IMPRESSION:    1. Large right upper lobe paramediastinal mass is concerning for   malignancy. This causes narrowing of the right mainstem bronchus.  2. Small nodules in the right upper lobe adjacent to the dominant mass     are nonspecific but could represent additional sites of malignancy.   Enlargedsubcarinal node is concerning for metastatic disease.  3. Multiple subcentimeter indeterminate pulmonary nodules in the   bilateral lungs.  4. Subcentimeter nodule in the subcutaneous fat along the left lateral   hemithorax is nonspecific. A small metastatic focus would be difficult to   exclude.      This was called to Kathlene Cote at 0901 hours 09/21/2012.        Verified By: Gregor Hams, M.D., MD  Nuclear Med:    11-Aug-14 14:15, PET/CT Scan Lung Cancer Diagnosis  PET/CT Scan Lung Cancer Diagnosis   REASON FOR EXAM:    lung mass  hx of breast CA  COMMENTS:       PROCEDURE: PET - PET/CT DX LUNG CA  - Oct 03 2012  2:15PM     RESULT: Indication: Lung mass  Radiopharmaceutical: 12.48 mCi F18-FDG,  intravenously.    Technique: Imaging was performed from the skull base to the mid-thigh   using routine PET/CT acquisition protocol.    Injection site: Left antecubital  Time of FDG injection: 1246 hours  Serum glucose: 134 mg/dL   Time of imaging: 1347 hours  Comparison studies: CT chest 09/21/2012    Findings:    HEAD AND NECK:     There is no abnormal hypermetabolic activity in the head and neck. There   is no cervical soft tissue mass or lymphadenopathy.     CHEST:    There is a large hypermetabolic right upper lobe mass extending towards   the right hilum with an SUV max of 18.7 and an SUV average of 13.7. There   is near complete collapse of the right lung. There is occlusion of the   right mainstem bronchus. There is a small right pleural effusion.  The heart size is normal. There is no pericardial effusion. There is   evidence of prior CABG.    There is evidence of bilateral axillary node dissection.    There is a small amount is a nodule along the left lateral hemithorax   within the subcutaneous fat without hypermetabolic activity.    The osseous structures demonstrate no focal abnormality.     ABDOMEN/PELVIS:    The liver demonstrates no focal abnormality. The gallbladder is   unremarkable. The spleen demonstrates no focal abnormality. The kidneys,   adrenal glands, pancreas are normal. The bladder is unremarkable.   The unopacified bowel is unremarkable. There is no pneumoperitoneum,   pneumatosis, or portal venous gas. There is no abdominal or pelvic free   fluid. There is no lymphadenopathy.     The abdominal aorta is normal in caliber.    The osseous structures are unremarkable.    IMPRESSION:     1. Large, hypermetabolic right upper lobe mass with with extension into   the right hilum and occlusion of the right mainstem bronchus most   concerning for malignancy. There is near complete collapse of the right   lung with a small right pleural  effusion.  Dictation Site: 1        Verified By: Jennette Banker, M.D., MD   Relevent Results:  Relevant Scans and Labs CT scans and PET scan are reviewed   Assessment and Plan: Impression:   stage IIIB probable non-small cell lung cancer as yet not biopsied. Plan:   at this time we are awaiting tissue confirmation of her malignancy. Believe we're dealing with a stage IIIB non-small cell lung cancer. Based on the patient's previous history of right mastectomy and chest wall radiation bilaterally would use IMRT treatment planning and delivery. Would minimize dose to her chest wall using this technique and maximize her dose where it is needed in the tumor volume. Would plan on delivering 6000 cGy over 6 weeks with concurrent chemotherapy. Risks and benefits of treatment including skin reaction, dysphagia secondary to radiation esophagitis, fatigue, alteration of blood counts, were explained in detail to the patient and her family. I have set her up in about a week's time for CT simulation and we'll use versus anterior chest wall as a dose avoidance region on my treatment planning. All this was explained in detail to the patient and her family. We'll wait tissue confirmation. I discussed the case personally with Dr. Grayland Ormond and Dr. Faith Rogue. like to take this opportunity for [facility] to participate in this patient's care  Electronic Signatures: Armstead Peaks (MD)  (Signed 11-Aug-14 15:51)  Authored: HPI, Diagnosis, Past Hx, PFSH, Allergies, Home Meds, ROS, Physical Exam, Other Results, Relevent Results, Encounter Assessment and Plan   Last Updated: 11-Aug-14 15:51 by Armstead Peaks (MD)

## 2014-06-15 NOTE — Consult Note (Signed)
patient admitted for presumed pneumonia and iv fluid for dehydration. full dictation to follow.  Electronic Signatures: Gerarda FractionFinnegan, Miriam Kestler (MD)  (Signed on 22-Sep-14 22:26)  Authored  Last Updated: 22-Sep-14 22:26 by Gerarda FractionFinnegan, Cammie Faulstich (MD)

## 2014-06-15 NOTE — Op Note (Signed)
PATIENT NAME:  Patricia Downs, [name]:  [id number] DATE OF BIRTH:  November 30, 1945  DATE OF PROCEDURE:  10/10/2012  SURGEON: Marcial Pacasimothy E. Thelma Bargeaks, MD  ASSISTANT: None.   PREOPERATIVE DIAGNOSIS: Lung cancer as well as basal cell carcinoma, right lower extremity.   POSTOPERATIVE DIAGNOSIS: Lung cancer as well as basal cell carcinoma, right lower extremity.   OPERATION PERFORMED:  1. Ultrasound-guided right internal jugular vein Port-A-Cath insertion.  2. Wide excision, right lower extremity basal cell carcinoma.   INDICATIONS FOR PROCEDURE: Ms. Patricia Downs is a 69 year old woman with a recent diagnosis of carcinoma of the lung. She requires a Port-A-Cath insertion for prolonged chemotherapy. In addition, she was recently diagnosed with a basal cell carcinoma on the right lower extremity and requested that to be resected as well. The indications and risks were explained to the patient, who gave her informed consent.   DESCRIPTION OF PROCEDURE: The patient was brought to the operating suite and placed in the supine position. Monitored anesthesia was given with a propofol infusion. The patient was prepped and draped in the usual sterile fashion. A timeout was called, and the proper patient was identified as well as the proper procedure and site. The right internal jugular vein was identified with the ultrasound probe and was percutaneously cannulated. A wire was placed into the right side of the heart. This was accomplished under fluoroscopic guidance. Once this was complete, we then turned our attention to creating the pocket on the anterior chest wall. This was done using lidocaine. Once the pocket was created, we then tunneled the catheter up through the right internal jugular puncture site. The catheter was then threaded over a peel-away sheath and positioned at the junction of the superior vena cava and right atrium. The catheter was then flushed, and flushed and irrigated nicely. It was then assembled, and  the entire port was connected to the pectoralis fascia with multiple interrupted 3-0 Prolene sutures. Once this was done, we then flushed the catheter again, and there were no problems with that. The wounds were then closed with Vicryl and nylon over the pocket site and nylon on the skin. The port site was accessed, and again it was irrigated and flushed, and the dressings were applied.   We then turned our attention to the right lower extremity. There was a 9 mm area just above the ankle on the medial aspect of the right leg. The patient was prepped and draped in the usual sterile fashion. Another timeout was called, and this was excised after infiltrating the skin with lidocaine. A 2 cm long by 1.5 cm wide ellipse was removed. The wound was then closed with Vicryl on the subcutaneous tissues and nylon on the skin. Sterile dressings were applied. The specimen was sent to pathology for permanent sections. The patient tolerated the procedure well and was taken back to the recovery room in stable condition.   ____________________________ Sheppard Plumberimothy E. Thelma Bargeaks, MD teo:OSi D: 10/10/2012 16:44:59 ET T: 10/11/2012 07:24:53 ET JOB#: [id number]  cc: Marcial Pacasimothy E. Thelma Bargeaks, MD, <Dictator> Jasmine DecemberIMOTHY E Marwin Primmer MD ELECTRONICALLY SIGNED 10/19/2012 14:05

## 2014-06-16 NOTE — Consult Note (Signed)
   Comments   Met with patient's son. He confirms decision for DNR (except he would want to continue pressors). He ultimately wants to get her home and her not be rehospitalized. He would agree with hospice involvement but asks about IV antibiotics in the home. I explained that I did not think this would be clinically indicated or helpful if goal was pt's comfort. All questions answered.  screening  Electronic Signatures: Dorthy Hustead, Kirt Boys (NP)  (Signed 03-Feb-15 12:25)  Authored: Palliative Care   Last Updated: 03-Feb-15 12:25 by Irean Hong (NP)

## 2014-06-16 NOTE — Discharge Summary (Signed)
PATIENT NAME:  Patricia Downs, Patricia Downs MR#:  [id number] DATE OF BIRTH:  17-Dec-1945  DATE OF ADMISSION:  03/13/2013 DATE OF DISCHARGE:  03/17/2013  DISCHARGE DIAGNOSES: 1. Acute on chronic hypoxic respiratory failure, likely due to pneumonia, will need to improving with antibiotics. 2.  Stage III squamous cell lung cancer, status post chemotherapy and radiation, following with Dr. Doylene Canning.  3.  Hypertension, controlled.  4.  Acute on chronic diastolic heart failure with last echo showing ejection fraction of 60% on torsemide.  5.  Chronic pain. Will require outpatient follow-up with palliative care or pain management and patient's son prefers that.    SECONDARY DIAGNOSES: 1.  Hypertension.  2.  Diabetes mellitus.  3.  Diastolic heart failure.  4.  Chronic respiratory failure requiring 2 liters oxygen at baseline continuous requirement.  5. Right hilar mass. 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  6.  Bilateral breast cancer.  7.  Chronic back pain, appears chronic.  8.  Hyponatremia.  9.  Left ear dermatitis.   10.  Atrial flutter.  11.  Coronary artery disease, status post stents placed. 12.  Hypothyroidism.   CONSULTATIONS: Oncology, Dr. Orlie Dakin physical therapy. Palliative care, Dr. Harvie Junior, physical therapy.    RADIOLOGY:  Chest x-ray 19th of January showed dense right-sided air space opacification, complicated with post obstructive pneumonia. Worsening left-sided air space disease concerning for pneumonia.   CT scan of the chest with contrast on 21st of January showed interval cavitation of dominant right upper lobe/paramediastinal mass corresponding with a history of squamous cell lung cancer. The mass appearing larger, but this could be due to central aeration and cavitation. There is improved/decreased mass effect upon the right main stem bronchus. There is multilobar airspace conservation, right greater than left. This is  worrisome for pneumonia, likely postobstructive.    MAJOR LABORATORY PANEL:  Blood cultures x 2 were negative on 19th of January.    Influenza A and B were negative.   HISTORY AND SHORT HOSPITAL COURSE:  The patient is a 69 year old female with above mentioned medical problems was admitted for difficulty breathing, was found to be in acute on chronic respiratory failure, thought to be secondary to pneumonia. Please see Dr. Prudencio Pair history and physical for further details. She required BiPAP initially and subsequently was switched over to nasal cannula. Oncology consultation was obtained with Dr. Orlie Dakin who recommended CT chest with results as above. Palliative consultation was obtained with Dr. Harvie Junior who felt adjusting some of the pain medication which seemed to help patient be comfortable. The patient was slowly improving and was close to her baseline on the 23rd of January with her antibiotic regimen and was discharged home in stable condition on the 23rd of January.   PERTINENT PHYSICAL EXAMINATION ON THE DATE OF DISCHARGE: VITAL SIGNS:  On the date of discharge, her vital signs were as follows: Temperature 97.8, heart rate 81 per minute, respirations 20 per minute, blood pressure 138/92, she was saturating 92% via 2 liters oxygen nasal cannula.   CARDIOVASCULAR: S1, S2 normal. No murmurs, gallops or rubs.  LUNGS: Clear to auscultation bilaterally. No wheezing, rales. ABDOMEN:  Soft, benign.  NEUROLOGIC: Nonfocal examination.  All other physical examination remained at baseline.   DISCHARGE MEDICATIONS: 1.   Coreg 25 mg p.o. b.i.d.  2.   Combivent 1 puff inhaled 4 times a day.  3.  Digoxin 125 mcg p.o. daily.  4.  Glimepiride 2 mg p.o. at bedtime.  5.  Levothyroxine 100 mcg p.o. daily.  6.  Metformin 1000 mg p.o. b.i.d.  7.  Aspirin 325 mg p.o. daily.  8.  Colace 10 mg p.o. b.i.d. 9.  Advair 250/50 1 puff b.i.d.  10.  Potassium chloride 20 mEq p.o. daily.  11.  Ondansetron 8 mg  p.o. every eight hours as needed. 12.  Fentanyl 50 mcg transdermal patch every 72 hours.  13.  Lortab 10/325 mg 1 every 6 hours as needed.  14.  Risperidone 0.25 mg p.o. at bedtime.  15.  Diazepam 10 mg p.o. b.i.d. as needed.  16.  Dilaudid 4 mg p.o. every 6 hours as needed.  17.  Fentanyl 100 mcg transdermal 72 hours.   18.  Fentanyl 12 mcg transdermal patch every 72 hours along with 50 mcg patch.  19.  Dulcolax 5 mg p.o. daily.  20.  Chlorpheniramine/hydrocodone p.o. every 12 hours as needed.  21.  Torsemide 20 mg p.o. daily.   22.  DuoNeb 3 mL q.6 hours as needed.  23.  Magnesium oxide 250 mg p.o. 3 times a day.  24.  Guiafenesin 10 mL every 4 hours p.r.n.   25.  Lortab 10/325 mg 1 to 2 tablets 4 times a day as needed.  26.  Augmentin 875/125 mg 1 tablet p.o. every 4 hour for 10 more days.  27.  Prednisone 60 mg p.o. daily until finished.  28.  Tamiflu 75 mg p.o. b.i.d. for one more day.   DISCHARGE DIET: Low sodium.   DISCHARGE ACTIVITY: As tolerated.   DISCHARGE INSTRUCTIONS AND FOLLOWUP:  The patient was instructed to follow up  with her primary care physician, Dr. Dorothey Basemanavid Bronstein, in 1 to 2 weeks. She will need follow-up with Dr. Saintclair Halstedobert Mcquaid from pulmonary in 2 to 4 weeks, with Dr. Doylene Canninghoksi in 4 to 6 weeks and with follow-up with Dr. Harvie JuniorPhifer per son's request for pain management as he agreed with oral pain management regimen while the patient was here. The patient was instructed to use BiPAP as needed, namely at night, but during the day also if she feels the need, especially for hypoxia and shortness of breath. She is set up to get home health, physical and occupational therapy and nurse along with nursing aide, and she will need 2 to 3 liters of  oxygen via nasal cannula continuous  and as needed while at home.   TOTAL TIME DISCHARGING THIS PATIENT: 55 minutes.    ____________________________ Ellamae SiaVipul S. Sherryll BurgerShah, MD vss:NTS D: 03/18/2013 18:17:00 ET T: 03/19/2013 05:18:04  ET JOB#: [id number]  cc: Teena Iraniavid M. Terance HartBronstein, MD Ned GraceNancy Phifer, MD Gerome SamJanak K. Doylene Canninghoksi, MD Tollie Pizzaimothy J. Orlie DakinFinnegan, MD Lema Heinkel S. Sherryll BurgerShah, MD, <Dictator>    Ellamae SiaVIPUL S [facility] Center[facility] MD ELECTRONICALLY SIGNED 03/21/2013 13:24

## 2014-06-16 NOTE — H&P (Signed)
PATIENT NAME:  Patricia Downs, Patricia Downs MR#:  [id number] DATE OF BIRTH:  08-25-1945  DATE OF ADMISSION:  03/27/2013  PRIMARY CARE PHYSICIAN: Teena Iraniavid M. Terance HartBronstein, MD  REFERRING PHYSICIAN: Rockne MenghiniAnne-Caroline Norman, MD  CHIEF COMPLAINT: Weakness for 2 days.   HISTORY OF PRESENT ILLNESS: A 69 year old Caucasian female with a history of hypertension, diabetes, CHF, lung cancer with multiple times of pneumonia, was sent to ED due to weakness 2 two days. The patient was just discharged 10 days ago after treatment for acute on chronic hypoxemic respiratory failure due to pneumonia. The patient continues to have cough, sputum, shortness of breath. The patient is on home oxygen 3 liters. For the past 2 days, the patient has been feeling weak, unable to get out of bed but the patient has no fever or chills. No chest pain, palpitations. No leg edema. The patient's blood pressure was noted to be at 80s in the ED. The patient was treated with 1 liter of normal saline but the blood pressure decreased to 77/52 so the patient was started with a Levophed drip. In addition, the patient's hemoglobin is 6.1. Stool occult is positive.   PAST MEDICAL HISTORY:  1.  Hypertension.  2.  Diabetes.  3.  Diastolic CHF, ejection fraction of 65%.  4.  Chronic respiratory failure, COPD, on home oxygen.  5.  Lung cancer, stage 4, status post chemo.  6.  Bilateral breast cancer, status post mastectomy.  7.  Chronic back pain.  8.  Chronic hyponatremia.  9.  A. fib, A. flutter, CAD, status post stent.  10.  Hypothyroidism.   PAST SURGICAL HISTORY: Bilateral mastectomies hysterectomy, porcine mitral valve repair.   SOCIAL HISTORY: Quit smoking 10 years ago. No alcohol drinking or illicit drugs.   FAMILY HISTORY: Niece has breast cancer. Father had A. fib. Mother had rheumatoid arthritis.   ALLERGIES: CEPHALOSPORIN, TAPE.   HOME MEDICATIONS: 1.  Potassium 20 mEq once a day.  2.  Zofran 8 mg p.o. every 8 hours p.r.n.  3.  Metformin  1000 mg p.o. b.i.d.  4.  Magnesium oxide 25 mg p.o. 3 times a day.  5.  Lortab 10/325 mg every 6 hours p.r.n.  6.  Levothyroxine 100 mcg p.o. daily.  7.  Levaquin 750 mg p.o. daily.  8.  Lansoprazole 30 mg p.o. daily.  9.  Guaifenesin 100 mg p.o. every 4 hours p.r.n. for cough.  10.  Glimepiride 2 mg p.o. at bedtime.  11.  Lasix 20 mg p.o. daily.  12.  Fentanyl patch 50 mcg, 2 patches every 72 hours.  13.  Zofran 4 mg, 2 tablets every 6 hours p.r.n.  14.  Digoxin 125 mcg p.o. 1 tablet once a day.  15.  Diazepam 10 mg p.o. b.i.d.  16.  Combivent 1 puff 4 times a day.  17.  Colace 100 mg p.o. b.i.d.  18.  Coreg 25 mg p.o. b.i.d.  19.  Aspirin 325 mg p.o. once a day.  20.  Augmentin, the patient finished Augmentin 10 days.  21.  Allopurinol 100 mg p.o. daily.  22.  DuoNebs 3 mL inhaled every 6 hours p.r.n.  23.  Advair 250 mcg p.o. b.i.d.   REVIEW OF SYSTEMS: CONSTITUTIONAL: The patient denies any fever or chills. No headache or dizziness but has generalized weakness.  EYES:  No double vision or blurry vision.  EARS, NOSE, THROAT: No postnasal drip, slurred speech or dysphagia.  CARDIOVASCULAR: No chest pain, palpitations. No orthopnea or nocturnal dyspnea. No leg edema.  PULMONARY: Positive for cough, sputum, shortness of breath.  No wheezing, no hemoptysis.  ABDOMEN: Soft. No distention. No tenderness. No organomegaly. Bowel sounds present.  GASTROINTESTINAL:  No abdominal pain, nausea, vomiting or diarrhea. No melena or bloody stool.  GENITOURINARY: No dysuria, hematuria or incontinence.  SKIN: No rash or jaundice.  NEUROLOGY: No syncope, loss of consciousness or seizure.  ENDOCRINE: No polyuria, polydipsia, heat or cold intolerance.  HEMATOLOGY: No easy bruising or bleeding.   PHYSICAL EXAMINATION:  VITAL SIGNS:  Temperature 96.2, blood pressure 82/56, pulse 65, respirations 20, O2  saturation 98% on BiPAP.  GENERAL: The patient is alert, awake, oriented, in no acute distress,  critically ill-looking. The patient is on BiPAP.   HEENT: Pupils round, equal and reactive to light and accommodation. Moist oral mucosa. Clear oropharynx. Pale conjunctivae. NECK: Supple. No JVD or carotid bruit. No lymphadenopathy. No thyromegaly. CARDIOVASCULAR: S1, S2 regular rate and rhythm. No murmurs or gallops. PULMONARY: Bilateral air entry. Bilateral crackles. No wheezing. No use of accessory muscle to breathe.  ABDOMEN: Soft. No distention or tenderness. No organomegaly. Bowel sounds present.  EXTREMITIES: No edema, clubbing or cyanosis. No calf tenderness. Bilateral pedal pulses  present.  SKIN: No rash or jaundice.  NEUROLOGIC: A and O x 3. No focal deficit. Power 3 out of 5 in bilateral lower extremities. Sensation intact.   LABORATORY, DIAGNOSTIC AND RADIOLOGICAL DATA:   1.  ABG showed pH 7.3, pCO2 of 63, pO2 146 with FiO2 of 30.  2.  Chest x-ray no significant changes since the previous study, persistent extensive areas of lung consolidation, necrotic area in the mid right upper lobe.  3.  BNP 10,148.  4.  WBC 6.3, hemoglobin 6.1, platelets 98.  5.  Glucose 60, BUN 21, creatinine 0.84, sodium 126, potassium 4.5, chloride 92, bicarb 33.  6.  INR 1.1. Troponin less than 0.02.   IMPRESSION: 1.  Hypotension, possibly due to anemia and sepsis.  2.  Hyponatremia.  3.  Anemia.  4.  Sepsis with possible pneumonia.  5.  Lung cancer stage 4.  6.  Hypoglycemia.  7.  Diabetes.  8.  Coronary artery disease.  9.  History of congestive heart failure with diastolic dysfunction.   PLAN OF TREATMENT: 1.  The patient will be admitted to CCU. We will hold the Lasix and Coreg due to hypotension. The patient was started with Levophed but the patient's blood pressure is still low at 70s. Also, the patient has developed bradycardia at 40 so we will start a dopamine drip. Also, we will give normal saline IV fluid.  2.  For anemia, we will give PRBC 2-unit transfusion and follow up hemoglobin.  Hold aspirin. The patient's stool occult is positive in the ED, possible gastrointestinal bleeding, but the patient's condition is critical, not a candidate for endoscopy at this time. The patient may need gastroenterology consult later.  3.  For sepsis and possible pneumonia, we will start antibiotics with vancomycin, Levaquin and Azactam. Follow up CBC, blood culture, sputum culture.  4.  For diabetes.  Since the patient had hypoglycemia, we will hold p.o. medication, start sliding scale.  5.  I discussed the patient's critical condition with the patient and the patient's son. The patient's son said the patient wants full code. We will requested palliative care consult again.   CRITICAL TIME SPENT: About 75 minutes.    ____________________________ Shaune Pollack, MD qc:cs D: 03/27/2013 17:14:50 ET Downs: 03/27/2013 18:15:03 ET JOB#: [id number]  cc: Shaune Pollack, MD, <  Dictator> Shaune Pollack MD ELECTRONICALLY SIGNED 03/29/2013 15:11

## 2014-06-16 NOTE — Consult Note (Signed)
Patient ill-appearing and in moderate respiratory distress. The entire review of systems is given by her son. This is patient's 4th admission in approximately one month and she is rapidly declining. After lengthy discussion with palliative care, they have elected to go home with hospice which seems to be appropriate. No further intervention is needed at this time. No followup is necessary in the Cancer Center. The patient and her son expressed understanding and were in agreement with this plan. consult, approximately 30 minutes was spent in discussion.  Electronic Signatures: Gerarda FractionFinnegan, Timothy (MD)  (Signed on 03-Feb-15 15:06)  Authored  Last Updated: 03-Feb-15 15:06 by Gerarda FractionFinnegan, Timothy (MD)

## 2014-06-16 NOTE — Discharge Summary (Signed)
ADDENDUM  PATIENT NAME:  Patricia FieldLILLEY, Cherrie T MR#:  [id number] DATE OF BIRTH:  Apr 04, 1945  DATE OF ADMISSION:  02/20/2013 DATE OF DISCHARGE: 03/03/2013   The patient did not get discharged yesterday as rehab facility did not have enough oxygen concentrators at the facility, so therefore the discharge was placed on hold. Today, the facility does have oxygen and can maintain her on oxygen via nasal cannula. Only medication change would be her Augmentin, which she would need for 2 days more instead of 3 at 875 mg dose. Today, the patient has no significant shortness of breath or complaints. Her temperature is 97.9, pulse rate is 79, respiratory rate 18, blood pressure 110/67, oxygen saturation is 98% on 2 L.  Generally, the patient is a well-developed female in good spirits sitting on bed in no obvious distress, talking in full sentences. Normocephalic, atraumatic. Lungs with pretty good air entry bilaterally. Scattered wheezing with significant improvement in the last several days. Abdomen is soft, nontender, nondistended. Extremities exhibit no pitting edema.   TOTAL TIME SPENT: 32 minutes.  ____________________________ Krystal EatonShayiq Amaiya Scruton, MD sa:aw D: 03/03/2013 11:29:10 ET T: 03/03/2013 11:42:59 ET JOB#: [id number]  cc: Krystal EatonShayiq Noa Constante, MD, <Dictator> Teena Iraniavid M. Terance HartBronstein, MD Darlin PriestlyKenneth A. Lady GaryFath, MD Krystal EatonSHAYIQ Roxie Gueye MD ELECTRONICALLY SIGNED 03/07/2013 11:35

## 2014-06-16 NOTE — Discharge Summary (Signed)
PATIENT NAME:  Patricia Downs, Cara T MR#:  [id number] DATE OF BIRTH:  [birth year]  DATE OF ADMISSION:  03/27/2013 DATE OF DISCHARGE:  03/29/2013  PRIMARY CARE PHYSICIAN: Teena Iraniavid M. Terance HartBronstein, MD  DISCHARGE DIAGNOSES: 1.  Acute on chronic respiratory failure. 2.  Chronic obstructive pulmonary disease. 3.  Lung cancer. 4.  Pneumonia.  5.  Anemia.   CONDITION: Poor.   CODE STATUS: DNR.  HOME MEDICATIONS:  1.  Colace 100 mg p.o. b.i.d. 2.  Advair Diskus 250 mcg/50 mcg 1 puff inhaled twice a day. 3.  Diazepam 10 mg p.o. twice a day p.r.n. for anxiety. 4.  DuoNeb 2.5 mg/0.5 mg per 3 mL inhaled every 6 hours p.r.n. 5.  Guaifenesin 100 mg/5 mL oral liquid, 10 mL every 4 hours p.r.n. 6.  Fentanyl patch 50 mcg per hour transdermal extended-release, 2 patches transdermal every 72 hours. 7.  Zofran 4 mg p.o. every 6 hours p.r.n.  8.  Hydromorphone 1 mg/ mL oral liquid, 8 mL every 3 hours p.r.n. for pain.  9.  Scopolamine 1.5 mg transdermal film, 1  patch transdermal every 72 hours.  The patient will be discharged to home with hospice care. The patient will need 6 liters oxygen by nasal cannula.   DIET: Regular diet.   ACTIVITY: As tolerated.   FOLLOWUP CARE: Follow up with PCP within 1 to 2 weeks.   REASON FOR ADMISSION: Weakness for 2 days.   HOSPITAL COURSE: The patient is a 69 year old Caucasian female with a history of hypertension, diabetes, CHF, lung cancer, multiple times pneumonia, was sent to ED due to weakness x 2 days, shortness of breath, cough. The patient is on home oxygen, 3 liters. The patient's blood pressure was low in the 80s in the ED, was treated with IV fluid support and started Levophed drip. The patient's hemoglobin was 6.1. Stool occult was positive.   For detailed history and physical examination, please refer to the admission note dictated by me.   The patient was initially admitted to CCU on Levophed. Lasix and Coreg were on hold due to hypotension. The patient got  IV fluid support,  Levophed drip and dopamine drip, Blood pressure has improved.   For the anemia, the patient got a PRBC transfusion, 2 units. Hemoglobin increased to 10 today.   Acute on chronic respiratory failure with possible pneumonia, lung cancer, and COPD. The patient has been treated with nebulizer, steroids, antibiotics. Still has shortness of breath, cough.   Dr. Harvie JuniorPhifer and Mr. Pamala HurryBorder, palliative care staff, discussed with the patient's son about palliative care. The patient's son and patient agreed for hospice care at home. The patient's code status is changed to DNR. The patient will be discharged to home with hospice care.   I discussed the patient's discharge plan with the patient, her son, Mr. Pamala HurryBorder, hospice staff, case manager, and nurse.   TIME SPENT: About 46 minutes.   ____________________________ Shaune PollackQing Genavieve Mangiapane, MD qc:jcm D: 03/29/2013 14:22:07 ET T: 03/29/2013 15:16:00 ET JOB#: [id number]  cc: Shaune PollackQing Lebron Nauert, MD, <Dictator> Shaune PollackQING Jerrelle Michelsen MD ELECTRONICALLY SIGNED 03/30/2013 16:10

## 2014-06-16 NOTE — Consult Note (Signed)
History of Present Illness:  Reason for Consult Lung cancer with recent chemotherapy, now with acute on chronic heart failure and pneumonia.   HPI   Patient again admitted to the hospital with worsening shortness of breath.  She was found to have acute on chronic heart failure as well as a postobstructive pneumonia.  Patient is currently on BIPAP and obtunded. Review of systems is unobtainable. No family at bedside.  PFSH:  Additional Past Medical and Surgical History Bilateral mastectomies: 1979 right-sided breast cancer status post XRT, 1998 left-sided breast cancer status post Adriamycin and Cytoxan and XRT.  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  GERD, CHF, hypothyroidism, diabetes, hypertension, total hysterectomy.  Family history: Sister with breast cancer who is also BRCA positive it hypertension.  Social history: Positive tobacco, with 1.5 packs per day x30 years.  Patient was offered smoking cessation.  Occasional alcohol use.   Review of Systems:  Performance Status (ECOG) 4   Review of Systems   unobtainable.   NURSING NOTES: **Vital Signs.:   19-Jan-15 13:43   Temperature Temperature (F): 97.9   Celsius: 36.6   Temperature Source: axillary   Pulse Pulse: 84   Respirations Respirations: 22   Systolic BP Systolic BP: 194   Diastolic BP (mmHg) Diastolic BP (mmHg): 84   Mean BP: 107   Pulse Ox % Pulse Ox %: 96   Pulse Ox Activity Level: At rest   Oxygen Delivery: Non-invasive ventilation (CPAP/BIPAP)   Physical Exam:  Physical Exam General: Ill-appearing, acute respiratory distress. Lungs: wheezing and rhonchi throughout bilaterally Heart: Regular rate and rhythm. No rubs, murmurs, or gallops. Abdomen: Soft, nontender, nondistended. No organomegaly noted, normoactive bowel sounds. Musculoskeletal: No edema, cyanosis, or clubbing. Neuro: obtunded Skin: No rashes or petechiae noted.    Cephalosporins: Rash  Tape:  Other    diazepam 10 mg oral tablet: 1 tab(s) orally 2 times a day, As Needed - for Anxiety, Nervousness, Status: Active, Quantity: 60, Refills: None   risperiDONE 0.25 mg oral tablet: 1 tab(s) orally once a day (at bedtime), Status: Active, Quantity: 30, Refills: None   fentaNYL 50 mcg/hr transdermal film, extended release: 1 patch transdermal every 72 hours, Status: Active, Quantity: 10, Refills: None   Dilaudid 4 mg oral tablet: 2 tab(s) orally every 6 hours, As Needed, Status: Active, Quantity: 40, Refills: None   Lortab 10/325: 1-2  tab(s)  4 times a day, As Needed - for Pain, Status: Active, Quantity: 40, Refills: None   predniSONE 20 mg oral tablet: 3 tab(s) orally once a day, Status: Active, Quantity: 8, Refills: None   ondansetron 8 mg oral tablet: 1 tab(s) orally every 8 hours, As Needed, Status: Active, Quantity: 90, Refills: 1   Advair Diskus 250 mcg-50 mcg inhalation powder: 1 puff(s) inhaled 2 times a day, Status: Active, Quantity: 60, Refills: None   carvedilol 25 mg oral tablet: 1 tab(s) orally 2 times a day, Status: Active, Quantity: 0, Refills: None   Combivent Respimat CFC free 100 mcg-20 mcg/inh inhalation aerosol: 1 puff(s) inhaled 4 times a day, Status: Active, Quantity: 120, Refills: None   digoxin 125 mcg (0.125 mg) oral tablet: 1 tab(s) orally once a day (in the morning), Status: Active, Quantity: 0, Refills: None   glimepiride 2 mg oral tablet: 1 tab(s) orally once a day (at bedtime), Status: Active, Quantity: 0, Refills: None   levothyroxine 100 mcg (0.1 mg) oral tablet: 1 tab(s) orally once a day (in the morning), Status: Active,  Quantity: 0, Refills: None   metFORMIN 1000 mg oral tablet: 1 tab(s) orally 2 times a day, Status: Active, Quantity: 0, Refills: None   aspirin 325 mg oral tablet: 1 tab(s) orally once a day, Status: Active, Quantity: 0, Refills: None   Colace sodium 100 mg oral capsule: 1 cap(s) orally 2 times a day, Status: Active, Quantity: 0,  Refills: None   potassium chloride 20 mEq oral tablet, extended release: 1 tab(s) orally once a day, Status: Active, Quantity: 0, Refills: None   Lortab 10/325: 1 tab(s) po every 6 hours, As Needed - for Pain, Status: Active, Quantity: 40, Refills: None   fentaNYL 12 mcg/hr transdermal film, extended release: 1 patch transdermal every 72 hours along with 6mg patch, Status: Active, Quantity: 0, Refills: None   bisacodyl 5 mg oral delayed release tablet: 1 tab(s) orally once a day, Status: Active, Quantity: 0, Refills: None   torsemide:  orally , Status: Active, Quantity: 0, Refills: None   Solu-MEDROL:  injectable , Status: Active, Quantity: 0, Refills: None   chlorpheniramine-HYDROcodone 8 mg-10 mg/5 mL oral suspension, extended release: 5 milliliter(s) orally every 12 hours, As Needed, Status: Active, Quantity: 0, Refills: None   torsemide 20 mg oral tablet: 1 tab(s) orally once a day, Status: Active, Quantity: 0, Refills: None   albuterol-ipratropium 2.5 mg-0.5 mg/3 mL inhalation solution: 3 milliliter(s) inhaled every 6 hours, As Needed, Status: Active, Quantity: 0, Refills: None   magnesium oxide 250 mg oral tablet: 1 tab(s) orally 3 times a day, Status: Active, Quantity: 0, Refills: None   guaiFENesin 100 mg/5 mL oral liquid: 10 milliliter(s) orally every 4 hours, As Needed, Status: Active, Quantity: 0, Refills: None   fentaNYL 100 mcg/hr transdermal film, extended release: 1 patch transdermal every 72 hours, Status: Active, Quantity: 10, Refills: None  Laboratory Results: Hepatic:  19-Jan-15 06:36   Bilirubin, Total 0.3  Alkaline Phosphatase 76 (45-117 NOTE: New Reference Range 01/13/13)  SGPT (ALT) 17  SGOT (AST) 16  Total Protein, Serum 6.8  Albumin, Serum  2.5  Routine Micro:  19-Jan-15 06:36   Micro Text Report BLOOD CULTURE   COMMENT                   NO GROWTH IN 8-12 HOURS   ANTIBIOTIC                       Culture Comment NO GROWTH IN 8-12 HOURS  Result(s)  reported on 13 Mar 2013 at 02:00PM.  Routine Chem:  19-Jan-15 06:36   Result Comment AUTO DIFF - HEMOGRAM PREVIOUSLY ORDERED. DR.  -Ouida SillsORDERED A CBC. AUTO DIFF ADDED  - TO HEMOGRAM FROM EMERGENCY ROOM.  Result(s) reported on 13 Mar 2013 at 09:38AM.  B-Type Natriuretic Peptide ([facility]  6303  Glucose, Serum  57  BUN  23  Creatinine (comp) 0.72  Sodium, Serum 140  Potassium, Serum 3.8  Chloride, Serum  97  CO2, Serum  41  Calcium (Total), Serum 8.5  Osmolality (calc) 281  eGFR (African American) >60  eGFR (Non-African American) >60 (eGFR values <630mmin/1.73 m2 may be an indication of chronic kidney disease (CKD). Calculated eGFR is useful in patients with stable renal function. The eGFR calculation will not be reliable in acutely ill patients when serum creatinine is changing rapidly. It is not useful in  patients on dialysis. The eGFR calculation may not be applicable to patients at the low and high extremes of body sizes, pregnant women, and vegetarians.)  Anion Gap  2  Cardiac:  19-Jan-15 06:36   CK, Total  15  CPK-MB, Serum 0.5 (Result(s) reported on 13 Mar 2013 at 07:15AM.)  Troponin I < 0.02 (0.00-0.05 0.05 ng/mL or less: NEGATIVE  Repeat testing in 3-6 hrs  if clinically indicated. >0.05 ng/mL: POTENTIAL  MYOCARDIAL INJURY. Repeat  testing in 3-6 hrs if  clinically indicated. NOTE: An increase or decrease  of 30% or more on serial  testing suggests a  clinically important change)  Routine Hem:  19-Jan-15 06:36   Neutrophil % 88.2  Lymphocyte % 6.7  Monocyte % 4.4  Eosinophil % 0.6  Basophil % 0.1  Neutrophil #  7.7  Lymphocyte #  0.6  Monocyte # 0.4  Eosinophil # 0.0  Basophil # 0.0  Reference Accession# [id number]  WBC (CBC) 8.8  RBC (CBC)  2.88  Hemoglobin (CBC)  8.7  Hematocrit (CBC)  26.5  Platelet Count (CBC) 254 (Result(s) reported on 13 Mar 2013 at 07:49AM.)  MCV 92  MCH 30.4  MCHC 33.0  RDW  21.9   Radiology Results: XRay:     19-Jan-15 07:00, Chest Portable Single View  Chest Portable Single View   REASON FOR EXAM:    SOB  COMMENTS:       PROCEDURE: DXR - DXR PORTABLE CHEST SINGLE VIEW  - Mar 13 2013  7:00AM     CLINICAL DATA:  Shortness of breath; known lung mass.    EXAM:  PORTABLE CHEST - 1 VIEW    COMPARISON:  None.    FINDINGS:  There is persistent dense right-sided airspace opacification, mildly  improved from the prior study, compatible with postobstructive  pneumonia. The patient's underlying right-sided lung mass is  difficult to fully characterize. Worsening left-sided airspace  disease is also concerning for pneumonia. No pleural effusion or  pneumothorax is seen.    The cardiomediastinal silhouette is normal in size. The patient is  status post median sternotomy. A right-sided chest port is noted  ending about the cavoatrialjunction. No acute osseous abnormalities  are seen. Clips are noted at both axilla.     IMPRESSION:  Slight interval improvement in dense right-sided airspace  opacification, compatible with postobstructive pneumonia. Underlying  right-sided lung mass difficult to fully characterize. Worsening  left-sided airspace disease also concerning for pneumonia.  Electronically Signed    By: Garald Balding M.D.    On: 03/13/2013 07:03         Verified By: JEFFREY . CHANG, M.D.,   Assessment and Plan: Impression:   Lung cancer with recent chemotherapy, now with acute on chronic heart failure and pneumonia. Plan:   1.  Shortness breath: Multifactorial secondary to pneumonia and acute on chronic heart failure.  Continue BiPAP.  This at least the patient's admission and less than a month.  Agree with palliative care consult. Lung cancer: Patient received her last infusion of chemotherapy with carboplatinum and Taxol on January 30, 2013.  She is scheduled for a restaging PET scan in early February. Can consider PET scan during admission, but the potential for false positive is  increased with underlying pneumonia.. Pneumonia: Continue current antibiotics. Heart failure: Continue Lasix. Anemia:  Likely secondary to chemotherapy.  Improved, no transfusion is needed at this time. Pain: Continue current narcotic regimen with fentanyl patch and Dilaudid. Disposition: Patient's prognosis is poor and considering hospice would not be unreasonable. Palliative care consult as above. consult, will follow.  Electronic Signatures: Delight Hoh (MD)  (Signed 19-Jan-15 18:02)  Authored: HISTORY  OF PRESENT ILLNESS, PFSH, ROS, NURSING NOTES, PE, ALLERGIES, HOME MEDICATIONS, LABS, OTHER RESULTS, ASSESSMENT AND PLAN   Last Updated: 19-Jan-15 18:02 by Delight Hoh (MD)

## 2014-06-16 NOTE — H&P (Signed)
PATIENT NAME:  Walden FieldLILLEY, [name] MR#:  [id number] DATE OF BIRTH:  Jun 06, 1945  DATE OF ADMISSION:  03/13/2013  ADMITTING PHYSICIAN: Enid Baasadhika Zyanya Glaza, MD  PRIMARY CARE PHYSICIAN: Teena Iraniavid M. Terance HartBronstein, MD  PRIMARY ONCOLOGIST: Tollie Pizzaimothy J. Orlie DakinFinnegan, MD  CHIEF COMPLAINT: Difficulty breathing.   HISTORY OF PRESENT ILLNESS: Ms. 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 status post chemotherapy and radiation, who has had a couple of hospitalizations in the last few months for recurrent pneumonias, comes [facility] secondary to difficulty breathing. The patient was just discharged from the hospital on March 03, 2013, when she was treated for right-sided postobstructive pneumonia. The patient is on 2 liters home oxygen at baseline. When she came in, she was on 4 liters with sats of 95%. However, she did have increased work of breathing, was very tachypneic, and she was placed on the BiPAP. The patient's lips look extremely dry. She looks weak and tired, but she is still alert and answering questions at this time. The patient was discharged on Augmentin for 3 more days after discharge about 10 days ago. She denies any fevers or chills but has been feeling hot. Complains of nausea, dry cough, and musculoskeletal chest pain from coughing. She denies being exposed to anybody who was sick; however, her sister who she has seen at the rehab for the last couple of days was just diagnosed with flu, but the patient has not been febrile.   PAST MEDICAL HISTORY: 1.  Hypertension.  2.  Non-insulin-dependent diabetes mellitus.  3.  Diastolic congestive heart failure with ejection fraction of 65%.  4.  Chronic respiratory failure.  5.  COPD, on 2 liters home oxygen.  6.  Right hilar mass.  7.  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  8.  Past history of bilateral breast cancer,  status post mastectomy.  9.  Chronic back pain.  10.  Chronic hyponatremia.  11.  Left ear dermatitis.  12.  Atrial flutter.  13.  Coronary artery disease, status post stent placement.  14.  Hypothyroidism.   PAST SURGICAL HISTORY: 1.  Bilateral mastectomies.  2.  Hysterectomy.  3.  Porcine mitral valve repair.   ALLERGIES TO MEDICATIONS: CEPHALOSPORINS AND ALSO TAPE.  CURRENT HOME MEDICATIONS:  1.  Advair 250/50, 1 puff b.i.d.  2.  Combivent Respimat 1 puff 4 times a day.  3.  Albuterol/ipratropium (DuoNeb) 3 mL q.6 hours p.r.n.  4.  Aspirin 325 mg p.o. daily.  5.  Bisacodyl 5 mg p.o. daily.  6.  Coreg 25 mg p.o. b.i.d.  7.  Tussionex 5 mL q.12 hours p.r.n. for cough.  8.  Colace 100 mg p.o. b.i.d.  9.  Diazepam 10 mg orally twice a day as needed for anxiety.  10.  Digoxin 125 mcg p.o. daily.  11.  Dilaudid 8 mg q.6 hours p.r.n. for pain.  12.  Fentanyl patch 100 mcg q.72 hours transdermal.  13.  Fentanyl 12 mcg transdermal q.72 hours.  14.  Fentanyl 50 mcg transdermal q.72 hours.  15.  Glimepiride 2 mg p.o. at bedtime.  16.  Guaifenesin cough syrup 10 mL q.4 hours p.r.n.  17.  Levothyroxine 100 mcg p.o. daily.  18.  Lortab 10/325 mg 1 to 2 tablets 4 times a day as needed for pain.  19.  Magnesium oxide 250 mg p.o. 3 times a day.  20.  Metformin 1000 mg  p.o. b.i.d.  21.  Zofran 8 mg q.8 hours p.r.n.  22.  Potassium chloride 20 mEq p.o. daily.  23.  Prednisone 60 mg p.o. daily.  24.  Risperidone 0.25 mg at bedtime.  25.  Torsemide 20 mg p.o. daily.   SOCIAL HISTORY: The patient was living at home prior to last hospitalization. However, she was discharged to [facility] the last hospitalization, which was 10 days ago. Quit smoking more than 10 years ago. No alcohol use or drug use.   FAMILY HISTORY: Niece has breast cancer. Dad with A. fib and mom with rheumatoid arthritis.   REVIEW OF SYSTEMS:    CONSTITUTIONAL: No fever. Positive for fatigue and weakness.  EYES: No  blurred vision, double vision, inflammation or glaucoma.  EARS, NOSE, THROAT: No tinnitus, ear pain, hearing loss, epistaxis or discharge.  RESPIRATORY: Positive for cough, wheeze. No hemoptysis.  CARDIOVASCULAR: Positive for musculoskeletal chest pain, orthopnea, edema, arrhythmia, palpitations or syncope.  GASTROINTESTINAL: Positive for nausea. No vomiting, diarrhea, abdominal pain, hematemesis or melena. Positive for constipation.  GENITOURINARY: No dysuria, hematochezia, renal calculus, frequency or incontinence.  ENDOCRINE: No polyuria, nocturia, thyroid problems, heat or cold intolerance.  HEMATOLOGIC: No anemia, easy bruising or bleeding.  SKIN: No acne, rash or lesions.  MUSCULOSKELETAL: Positive for generalized body pains, especially chronic back pain. No arthritis or gout.  NEUROLOGIC: No numbness, weakness, CVA, TIA or seizures.  PSYCHOLOGICAL: Positive for history of anxiety. No insomnia or depression.   PHYSICAL EXAMINATION: VITAL SIGNS: Temperature 96.5 degrees Fahrenheit, pulse 83, respirations 20, blood pressure 156/87, pulse oximetry 97% on BiPAP.  GENERAL: Well-built, ill-nourished female lying in bed in mild respiratory distress.  HEENT: Normocephalic, atraumatic. Pupils equal, round, reacting to light. Anicteric sclerae. Extraocular movements intact. Oropharynx: Extremely dry mucous membranes. No erythema, mass or exudates.  NECK: Supple. No thyromegaly, JVD or carotid bruits.  LUNGS: Moving air bilaterally. Scattered wheezes and rhonchi heard, especially on the left side, and decreased breath sounds on the right side. No crackles. No use of accessory muscles for breathing while on the BiPAP.  CARDIOVASCULAR: S1, S2, rapid rate, irregular rhythm. No murmurs, rubs or gallops.  ABDOMEN: Soft, nontender, nondistended. No hepatosplenomegaly. Normal bowel sounds.  EXTREMITIES: No pedal edema, no clubbing or cyanosis, 2+ dorsalis pedis pulses palpable bilaterally.  SKIN: No acne,  rash or lesions.  LYMPHATICS: No cervical or inguinal lymphadenopathy.  NEUROLOGIC: Cranial nerves intact. No focal motor or sensory deficits. PSYCHIATRIC: The patient is awake, alert, oriented x3.   LABORATORY DATA: WBC is 8.8, hemoglobin 8.7, hematocrit 26.5, platelet count 254.   Sodium 140, potassium 3.8, chloride 99, bicarbonate 41, BUN 23, creatinine 0.72, glucose 57 and calcium of 8.5.   ALT 17, AST 16, alkaline phosphatase 76, total bilirubin 0.3 and albumin of 3.5.   CK 15, CK-MB 0.5. Troponin less than 0.02. BNP is elevated at 6303. ABG showing pH of 7.47, pCO2 of 63, pO2 of 68, bicarbonate of 46, and sats of 93% on 35% FiO2 on the BiPAP.  Chest x-ray showing interval improvement in right dense airspace disease compatible with postobstructive pneumonia. Underlying right-sided lung mass difficult to fully characterize. Worsening left-sided airspace disease concerning for pneumonia.   Influenza test is negative.   ASSESSMENT AND PLAN: A 69 year old female with history of stage III squamous cell carcinoma of the right lung, status post chemoradiation, coronary artery disease, congestive heart failure, diabetes mellitus, recent hospitalizations for pneumonia, comes from [facility] for respiratory distress.  1.  Acute on chronic  respiratory failure secondary to pneumonia, especially on the left side. Will treat as healthcare-acquired pneumonia due to recent hospitalization. Blood cultures have been drawn. She is started on vancomycin, meropenem, and Levaquin at this time. Her right-sided infiltrate from the last x-ray shows improvement; however, left-sided new infiltrates are seen. She is on baseline 2 liters of oxygen, currently on BiPAP for increased work of breathing. We will try to see if we can wean her off BiPAP or do on-and-off with BiPAP and nasal cannula. Continue her p.o. prednisone for maintenance.  2.  Stage III squamous cell carcinoma of the right lung, status post  chemoradiation, recurrent admission for pneumonia, so will get Dr. Orlie Dakin on board. Palliative care had seen the patient last admission. If no improvement in the next 24 hours, will reconsult them again.  3.  Hypertension and history of atrial flutter: Continue her Coreg. 4.  Coronary artery disease, status post stent, and congestive heart failure: Last echocardiogram with ejection fraction of 60%, likely diastolic dysfunction. However, hold torsemide, as clinically dry. Start IV fluids. Aspirin, Coreg, and statin are started.  5.  Chronic pain: Continue home medications at this time and monitor her respiratory status with the same. 6.  Hypothyroidism: Continue Synthroid.  7.  Deep venous thrombosis prophylaxis with subcutaneous heparin. 8.  Code status is full code at this time.   TIME SPENT ON ADMISSION: 50 minutes.   ____________________________ Enid Baas, MD rk:jcm D: 03/13/2013 16:19:21 ET T: 03/13/2013 18:12:48 ET JOB#: [id number]  cc: Enid Baas, MD, <Dictator> Tollie Pizza. Orlie Dakin, MD Teena Irani. Terance Hart, MD Enid Baas MD ELECTRONICALLY SIGNED 03/23/2013 13:40

## 2014-06-16 NOTE — Discharge Summary (Signed)
PATIENT NAME:  Patricia Downs, Patricia Downs MR#:  [id number] DATE OF BIRTH:  [birth year]  DATE OF ADMISSION:  02/20/2013 DATE OF DISCHARGE:  03/02/1929   CONSULTANTS: Dr. Lady Gary from cardiology. Dr. Orlie Dakin from cancer center. Dr. Harriett Sine Phifer from palliative care.   CHIEF COMPLAINT: Weakness, shortness of breath, wheezing.   DISCHARGE DIAGNOSES:  1.  Acute-on-chronic diastolic congestive heart failure.  2.  Chronic respiratory failure.  3.  Acute chronic obstructive pulmonary disease exacerbation.  4.  Postobstructive pneumonia.  5.  Restless leg syndrome.  6.  Severe hypokalemia.  7.  Hypomagnesemia.  8.  Squamous cell cancer of the lung.  9.  Bilateral breast cancer, status post mastectomy.  10.  Chronic pain.  11.  Hyponatremia, chronic.  12.  Left ear dermatitis.  13.  Atrial flutter.  14.  Generalized deconditioning.  15.  History of diabetes.  16.  Coronary artery disease.  17.  Status post stent placement.  18.  Hypothyroidism.   DISCHARGE MEDICATIONS: Carvedilol 25 mg two times a day, Combivent 1 puff four times a day, digoxin 125 mcg daily, glimepiride 2 mg daily, levothyroxine 100 mcg daily, metformin 1000 mg two times a day, aspirin 325 mg once a day, Colace 100 mg two times a day, Dulcolax 5 mg once a day, Advair 250/50 mcg one puff 2 times a day, potassium chloride 20 mEq once a day, prednisone 10 mg daily, Zofran 8 mg every 8 hours as needed, fentanyl patch 50 mcg/hour extended-release 1 patch every 72 hours, Dilaudid 4 mg every 6 hours as needed for pain, Lortab 10/325, one tab every 6 hours as needed for pain, risperidone 0.25 mg at bedtime, diazepam 10 mg 2 times a day as needed, furosemide 20 mg two times a day, Augmentin 875 mg two times a day for 3 more days. Going home on 2 liters nasal cannula continuous.   DIET: Low sodium, low fat, low cholesterol, ADA diet.   ACTIVITY: As tolerated.   FOLLOWUP: Please follow with PCP within 1 to 2 weeks.   CODE STATUS: FULL CODE.    HISTORY OF PRESENT ILLNESS AND HOSPITAL COURSE: For full details of H and P, please see the dictation on December 29 by Dr. Imogene Burn, but briefly this is a 69 year old with multiple comorbidities who came in with shortness of breath. Of note, the patient had a recent hospitalization and was discharged. Was doing good until a few days before admission where she started to have shortness of breath, wheezing, weakness, and came into the hospital.   She was noted to have x-rays showing right-sided opacity with a BNP of 15,000. She was admitted to the hospitalist service for acute-on-chronic respiratory failure and acute-on-chronic CHF, diastolic in nature. She was started on Lasix, CHF protocol as well as broad-spectrum antibiotics for pneumonia which likely was postobstructive in nature given the history of lung cancer.   Her oncologist was consulted and the patient was seen by Dr. Orlie Dakin. The patient had sputum cultures sent which did show staph. She was initially on vanc, meropenem, and her antibiotic was changed to Augmentin, and she is to stop taking the Augmentin on the 11th after her last dose. She was also on steroids, nebulizers, and her inhalers, and prednisone has been tapered. She does require oxygen chronically and she is on 2 to 3 liters.   In regard to acute-on-chronic diastolic CHF, she has been transitioned to Lasix 20 mg b.i.d. She does excessively drink water and she was counseled about that. The  patient was noted to have atrial flutter, and she has had extensive workup as an outpatient for irregular rhythms and bouts of tachycardia including Holter monitoring and EKGs.   I did discuss the case with Dr. Lady GaryFath, her outpatient cardiologist, who recommended no anticoagulation at this point in and full-dose aspirin as the patient's rate is controlled.   Her chronic pain medications have been continued. She had bouts of hypokalemia and hypomagnesemia, and they were replaced.   At this point,  she back to her baseline and she will be getting discharged with outpatient followup.   ____________________________ Krystal EatonShayiq Beyonka Pitney, MD sa:np D: 03/02/2013 14:45:48 ET T: 03/02/2013 15:01:00 ET JOB#: [id number]  cc: Krystal EatonShayiq Makena Mcgrady, MD, <Dictator> Teena Iraniavid M. Terance HartBronstein, MD Krystal EatonSHAYIQ Rosalina Dingwall MD ELECTRONICALLY SIGNED 03/07/2013 11:35

## 2014-12-07 IMAGING — CT CT CHEST W/ CM
2 of 4 series · 15 of 36 positions shown, 18 images · IV contrast (isovue)
Comparison: Chest radiograph 03/13/2013, most recent chest CT
09/21/2012

CLINICAL DATA: Restaging squamous cell lung cancer, shortness of
breath. Currently undergoing chemotherapy. History of breast cancer
with bilateral mastectomies. Previous history of bilateral radiation
therapy for breast cancer.

EXAM:
CT CHEST WITH CONTRAST
TECHNIQUE: Multidetector CT imaging of the chest was performed during
intravenous contrast administration.
CONTRAST:  75 cc Isovue 370 IV contrast

[Series 2: routine chest with · axial · 0.72mm/px · z∈[-644,-414]mm · 12 of 56 slices shown, 15 images]
[im 5/56  mediastinal]
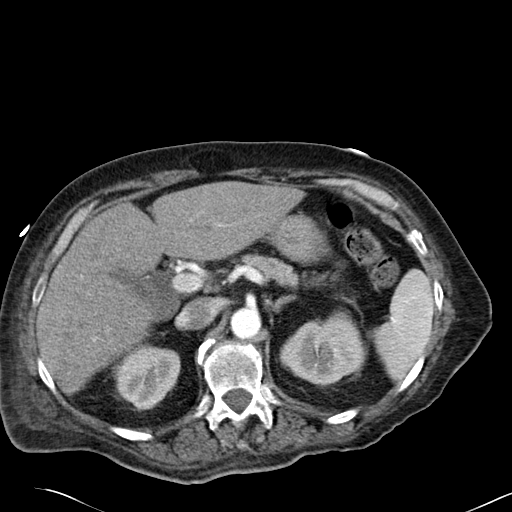
[im 5/56  lung]
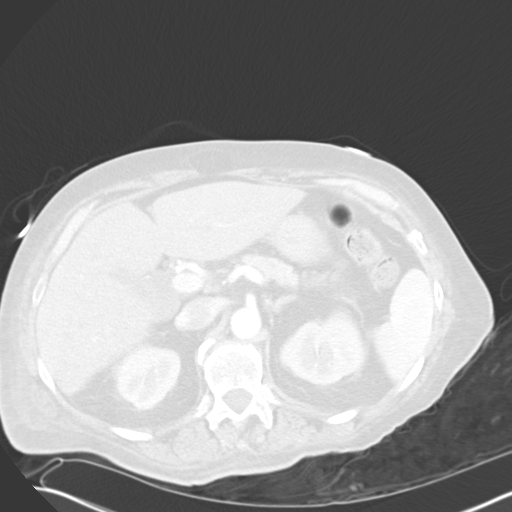
[im 9/56  lung]
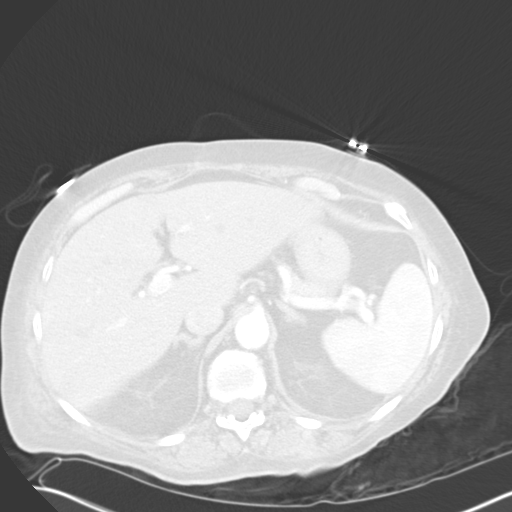
[im 13/56  lung]
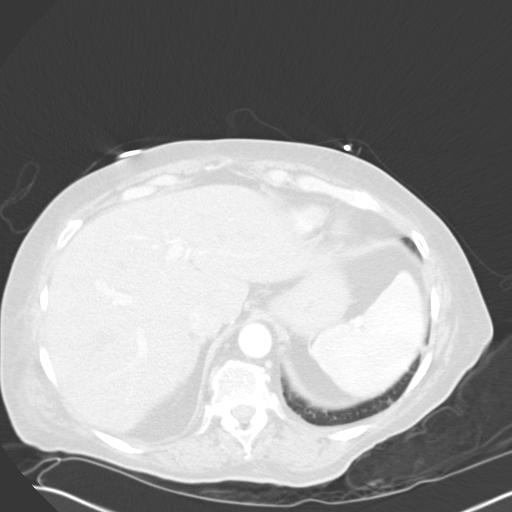
[im 17/56  lung]
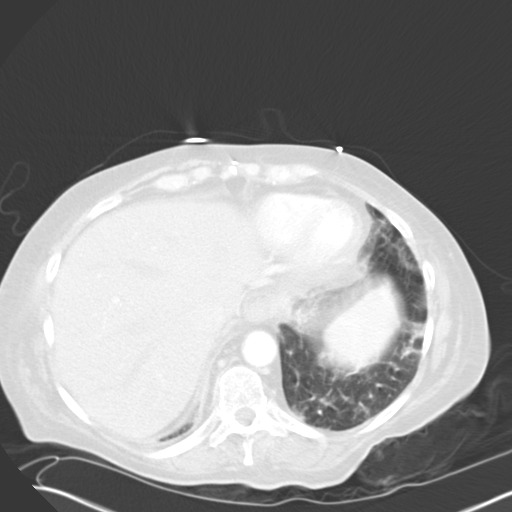
[im 22/56  mediastinal]
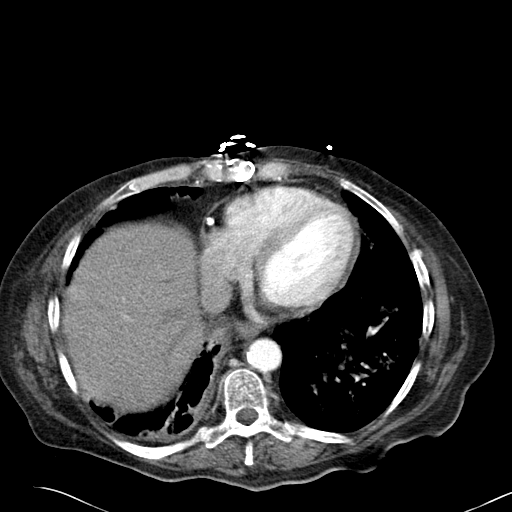
[im 22/56  lung]
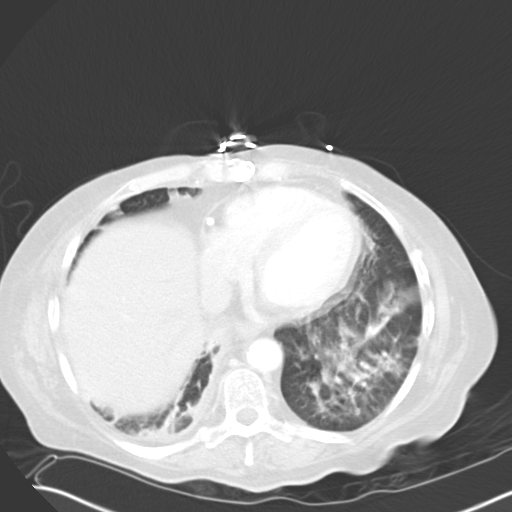
[im 26/56  lung]
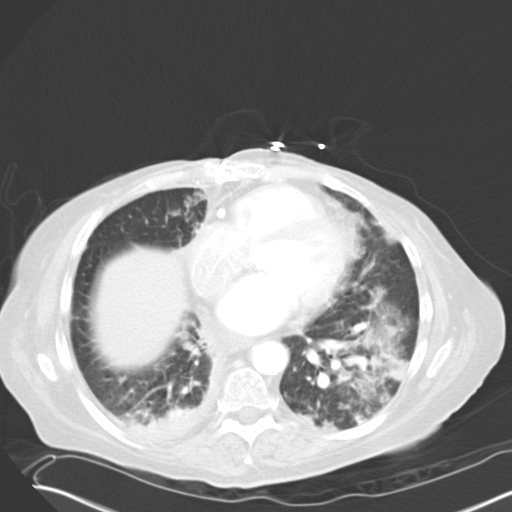
[im 30/56  lung]
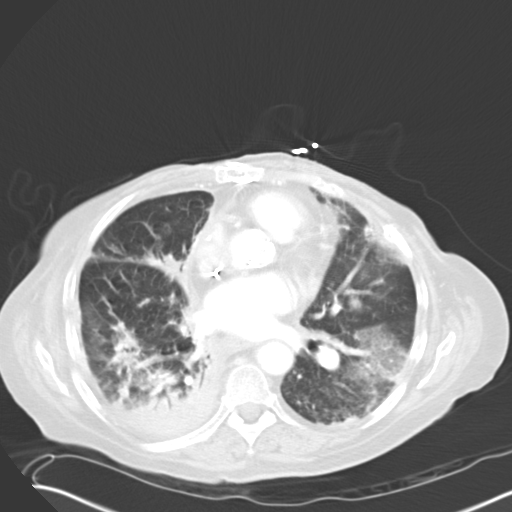
[im 34/56  lung]
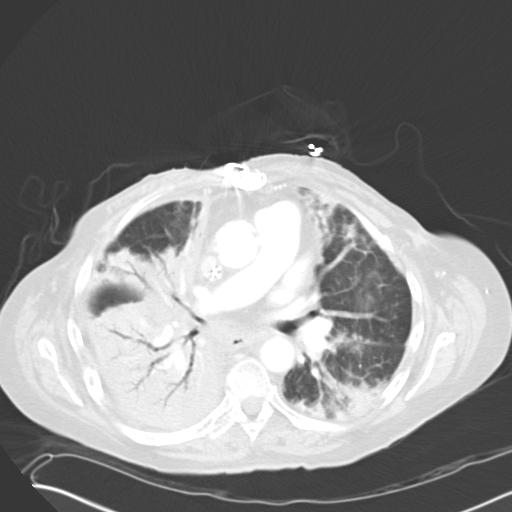
[im 39/56  mediastinal]
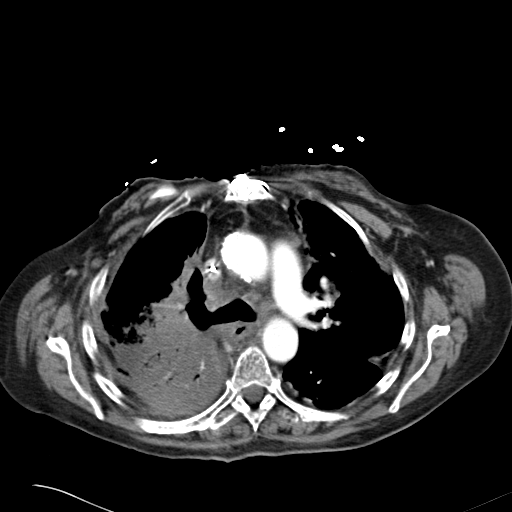
[im 39/56  lung]
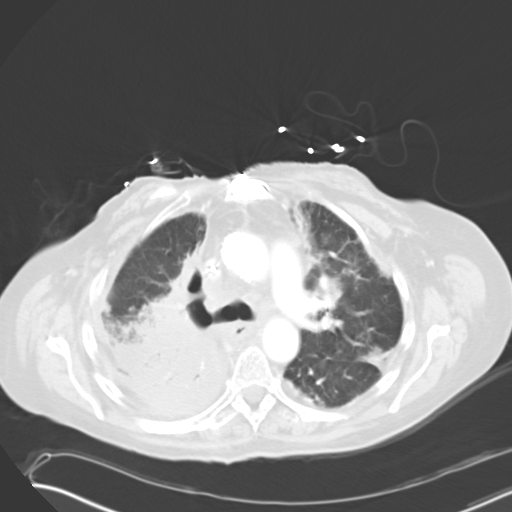
[im 43/56  lung]
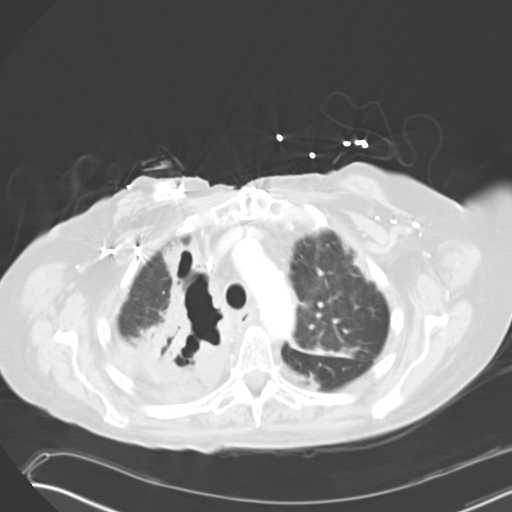
[im 47/56  lung]
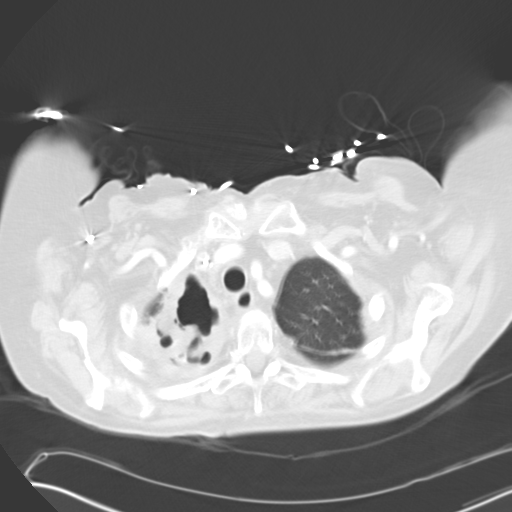
[im 51/56  lung]
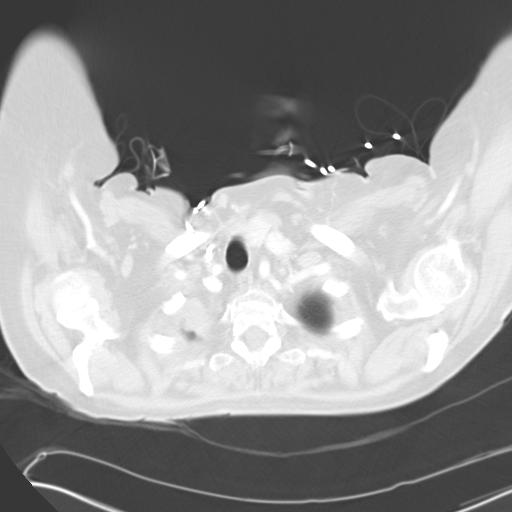

[Series 5: cor routine chest with · coronal · 0.68mm/px · 3 of 129 slices shown]
[im 26/129  lung]
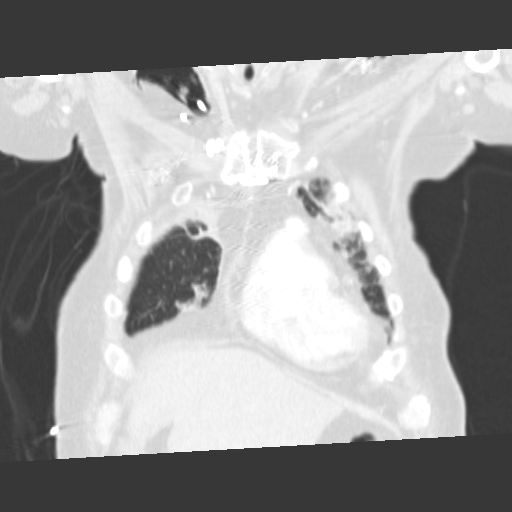
[im 52/129  lung]
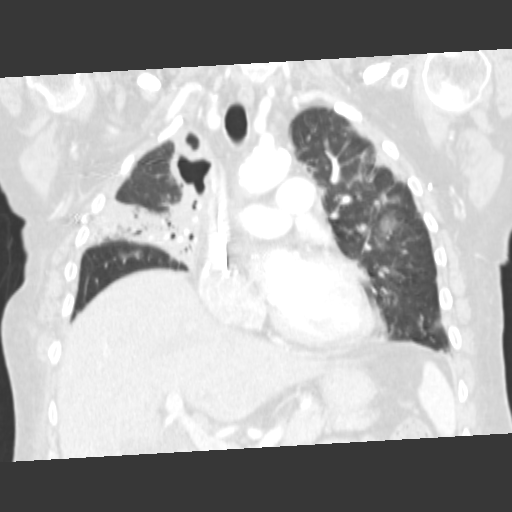
[im 77/129  lung]
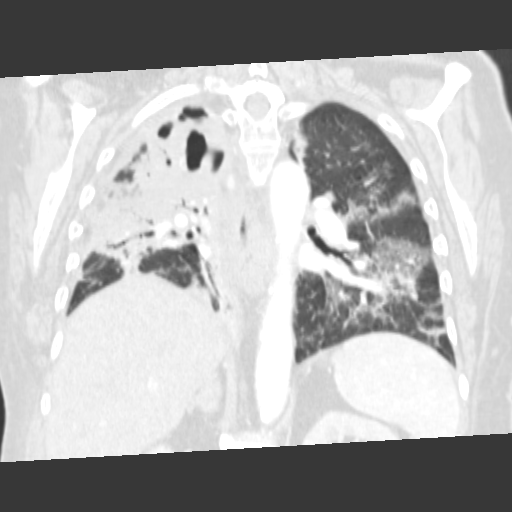

[15 of 36 positions shown; findings below may reference images not displayed]

FINDINGS: Detail degraded by respiratory motion. Evidence of bilateral
mastectomy and median sternotomy with aortic valvuloplasty.
Right-sided Port-A-Cath in place with tip in the distal SVC. Heart
size is normal. No pericardial effusion. Bilateral axillary clips
are noted. No measurable lymphadenopathy, although there is
confluent soft tissue density abutting the right hilum as discussed
further below.

There has been interval development of multilobar airspace
consolidation involving primarily the right upper lobe, right middle
lobe, and right lower lobe, with other areas of patchy
consolidation, at internal air bronchogram formation, and
ground-glass airspace opacity elsewhere. Interval cavitation of the
right peritracheal/perihilar upper lobe mass is identified. Due to
close proximity with adjacent consolidated lung, the posterior
margins are not well visualized, but the centrally cavitary masslike
area currently measures approximately 9.1 x 5.7 cm image 12 compared
to 6.7 x 5.7 cm at the similar anatomic level previously. Mass
effect upon the right mainstem bronchus is improved. Central airways
are clear. No new lytic or sclerotic osseous abnormality.
Irregularity, possibly degenerative change, is reidentified at the
proximal right clavicle. No new compression deformity.
IMPRESSION: Interval cavitation of dominant right upper lobe/ paramediastinal
mass corresponding to the reported history of squamous cell lung
cancer. The masslike area appears larger than previously but this
could in part reflect central aeration and cavitation. There is
improved/ decreased mass effect upon the right mainstem bronchus.

However, there has been interval development of multi lobar airspace
consolidation, right greater than left, with internal air
bronchogram formation. The appearance is most typical for pneumonia
which may be in part post obstructive given the previous mass effect
upon the right mainstem bronchus. However, pneumonitis related to
chemotherapy could appear similar. Wedge-shaped consolidation
peripherally may also be seen with pulmonary embolism, although on
this nondedicated exam there is no gross evidence for central large
focal filling defect. These findings of pulmonary consolidation
could account for the patient's reported history of increased
shortness of breath.

These results will be called to the ordering clinician or
representative by the Radiologist Assistant, and communication
documented in the PACS Dashboard.
